# Patient Record
Sex: Female | Born: 1985 | Race: White | Hispanic: No | Marital: Married | Smoking: Never smoker
Health system: Southern US, Community
[De-identification: ages and names within clinical notes are randomized; demographics above are authoritative.]

## PROBLEM LIST (undated history)

## (undated) DIAGNOSIS — Z789 Other specified health status: Secondary | ICD-10-CM

## (undated) DIAGNOSIS — Z973 Presence of spectacles and contact lenses: Secondary | ICD-10-CM

---

## 2016-12-19 ENCOUNTER — Encounter (HOSPITAL_COMMUNITY): Payer: Self-pay | Admitting: *Deleted

## 2016-12-20 ENCOUNTER — Ambulatory Visit (HOSPITAL_COMMUNITY): Payer: BLUE CROSS/BLUE SHIELD | Admitting: Anesthesiology

## 2016-12-20 ENCOUNTER — Encounter (HOSPITAL_COMMUNITY): Payer: Self-pay | Admitting: *Deleted

## 2016-12-20 ENCOUNTER — Ambulatory Visit (HOSPITAL_COMMUNITY)
Admission: RE | Admit: 2016-12-20 | Discharge: 2016-12-20 | Disposition: A | Discharge status: Home / Self Care | Payer: BLUE CROSS/BLUE SHIELD | Source: Ambulatory Visit | Attending: Obstetrics and Gynecology | Admitting: Obstetrics and Gynecology

## 2016-12-20 ENCOUNTER — Encounter (HOSPITAL_COMMUNITY)
Admission: RE | Disposition: A | Discharge status: Home / Self Care | Payer: Self-pay | Source: Ambulatory Visit | Attending: Obstetrics and Gynecology

## 2016-12-20 DIAGNOSIS — O021 Missed abortion: Secondary | ICD-10-CM | POA: Insufficient documentation

## 2016-12-20 HISTORY — PX: DILATION AND EVACUATION: SHX1459

## 2016-12-20 SURGERY — DILATION AND EVACUATION, UTERUS
Anesthesia: General

## 2016-12-20 MED ORDER — ONDANSETRON HCL 4 MG/2ML IJ SOLN
INTRAMUSCULAR | Status: DC | PRN
  Administered 2016-12-20: 4 mg via INTRAVENOUS

## 2016-12-20 MED ORDER — FENTANYL CITRATE (PF) 100 MCG/2ML IJ SOLN
INTRAMUSCULAR | Status: DC | PRN
  Administered 2016-12-20 (×2): 50 ug via INTRAVENOUS

## 2016-12-20 MED ORDER — PROPOFOL 10 MG/ML IV BOLUS
INTRAVENOUS | Status: AC
  Filled 2016-12-20: qty 20

## 2016-12-20 MED ORDER — OXYCODONE HCL 5 MG/5ML PO SOLN: 5.0000 mg | Freq: Once | ORAL | Status: DC | PRN

## 2016-12-20 MED ORDER — MIDAZOLAM HCL 2 MG/2ML IJ SOLN
INTRAMUSCULAR | Status: DC | PRN
  Administered 2016-12-20 (×2): 1 mg via INTRAVENOUS

## 2016-12-20 MED ORDER — MIDAZOLAM HCL 2 MG/2ML IJ SOLN
INTRAMUSCULAR | Status: AC
  Filled 2016-12-20: qty 2

## 2016-12-20 MED ORDER — SCOPOLAMINE 1 MG/3DAYS TD PT72
1.0000 | MEDICATED_PATCH | Freq: Once | TRANSDERMAL | Status: DC
Start: 2016-12-20 — End: 2016-12-20
  Administered 2016-12-20: 1.5 mg via TRANSDERMAL

## 2016-12-20 MED ORDER — ONDANSETRON HCL 4 MG/2ML IJ SOLN
INTRAMUSCULAR | Status: AC
  Filled 2016-12-20: qty 2

## 2016-12-20 MED ORDER — LACTATED RINGERS IV SOLN
INTRAVENOUS | Status: DC
  Administered 2016-12-20: 12:00:00 via INTRAVENOUS

## 2016-12-20 MED ORDER — LIDOCAINE HCL 1 % IJ SOLN
INTRAMUSCULAR | Status: AC
  Filled 2016-12-20: qty 20

## 2016-12-20 MED ORDER — ONDANSETRON HCL 4 MG/2ML IJ SOLN: 4.0000 mg | Freq: Once | INTRAMUSCULAR | Status: DC | PRN

## 2016-12-20 MED ORDER — DEXTROSE-NACL 5-0.9 % IV SOLN: INTRAVENOUS | Status: DC

## 2016-12-20 MED ORDER — DEXAMETHASONE SODIUM PHOSPHATE 10 MG/ML IJ SOLN
INTRAMUSCULAR | Status: DC | PRN
  Administered 2016-12-20: 4 mg via INTRAVENOUS

## 2016-12-20 MED ORDER — LIDOCAINE HCL (CARDIAC) 20 MG/ML IV SOLN
INTRAVENOUS | Status: DC | PRN
  Administered 2016-12-20: 60 mg via INTRAVENOUS

## 2016-12-20 MED ORDER — SCOPOLAMINE 1 MG/3DAYS TD PT72: 1.0000 | MEDICATED_PATCH | Freq: Once | TRANSDERMAL | Status: DC

## 2016-12-20 MED ORDER — LACTATED RINGERS IV SOLN: INTRAVENOUS | Status: DC

## 2016-12-20 MED ORDER — LIDOCAINE HCL 1 % IJ SOLN
INTRAMUSCULAR | Status: DC | PRN
  Administered 2016-12-20: 20 mL

## 2016-12-20 MED ORDER — ACETAMINOPHEN 160 MG/5ML PO SOLN: 325.0000 mg | ORAL | Status: DC | PRN

## 2016-12-20 MED ORDER — HYDROCODONE-ACETAMINOPHEN 5-325 MG PO TABS: ORAL_TABLET | ORAL | 0 refills | Status: DC

## 2016-12-20 MED ORDER — OXYCODONE HCL 5 MG PO TABS: 5.0000 mg | ORAL_TABLET | Freq: Once | ORAL | Status: DC | PRN

## 2016-12-20 MED ORDER — SCOPOLAMINE 1 MG/3DAYS TD PT72
MEDICATED_PATCH | TRANSDERMAL | Status: AC
  Administered 2016-12-20: 1.5 mg via TRANSDERMAL
  Filled 2016-12-20: qty 1

## 2016-12-20 MED ORDER — KETOROLAC TROMETHAMINE 30 MG/ML IJ SOLN
30.0000 mg | Freq: Once | INTRAMUSCULAR | Status: DC | PRN
  Administered 2016-12-20: 30 mg via INTRAVENOUS

## 2016-12-20 MED ORDER — LIDOCAINE HCL (CARDIAC) 20 MG/ML IV SOLN
INTRAVENOUS | Status: AC
  Filled 2016-12-20: qty 5

## 2016-12-20 MED ORDER — ACETAMINOPHEN 325 MG PO TABS: 325.0000 mg | ORAL_TABLET | ORAL | Status: DC | PRN

## 2016-12-20 MED ORDER — KETOROLAC TROMETHAMINE 30 MG/ML IJ SOLN
INTRAMUSCULAR | Status: AC
  Filled 2016-12-20: qty 1

## 2016-12-20 MED ORDER — FENTANYL CITRATE (PF) 100 MCG/2ML IJ SOLN: 25.0000 ug | INTRAMUSCULAR | Status: DC | PRN

## 2016-12-20 MED ORDER — PROPOFOL 10 MG/ML IV BOLUS
INTRAVENOUS | Status: DC | PRN
  Administered 2016-12-20 (×2): 10 mg via INTRAVENOUS
  Administered 2016-12-20: 20 mg via INTRAVENOUS

## 2016-12-20 MED ORDER — MEPERIDINE HCL 25 MG/ML IJ SOLN: 6.2500 mg | INTRAMUSCULAR | Status: DC | PRN

## 2016-12-20 MED ORDER — IBUPROFEN 600 MG PO TABS: 600.0000 mg | ORAL_TABLET | Freq: Four times a day (QID) | ORAL | 0 refills | Status: DC | PRN

## 2016-12-20 MED ORDER — FENTANYL CITRATE (PF) 250 MCG/5ML IJ SOLN
INTRAMUSCULAR | Status: AC
Start: 2016-12-20 — End: ?
  Filled 2016-12-20: qty 5

## 2016-12-20 SURGICAL SUPPLY — 19 items
CATH ROBINSON RED A/P 16FR (CATHETERS) ×2 IMPLANT
CLOTH BEACON ORANGE TIMEOUT ST (SAFETY) ×2 IMPLANT
DECANTER SPIKE VIAL GLASS SM (MISCELLANEOUS) ×2 IMPLANT
DILATOR CANAL MILEX (MISCELLANEOUS) IMPLANT
GLOVE BIO SURGEON STRL SZ7 (GLOVE) ×2 IMPLANT
GLOVE BIOGEL PI IND STRL 7.0 (GLOVE) ×1 IMPLANT
GLOVE BIOGEL PI INDICATOR 7.0 (GLOVE) ×1
GOWN STRL REUS W/TWL LRG LVL3 (GOWN DISPOSABLE) ×4 IMPLANT
KIT BERKELEY 1ST TRIMESTER 3/8 (MISCELLANEOUS) ×2 IMPLANT
NS IRRIG 1000ML POUR BTL (IV SOLUTION) ×2 IMPLANT
PACK VAGINAL MINOR WOMEN LF (CUSTOM PROCEDURE TRAY) ×2 IMPLANT
PAD OB MATERNITY 4.3X12.25 (PERSONAL CARE ITEMS) ×2 IMPLANT
PAD PREP 24X48 CUFFED NSTRL (MISCELLANEOUS) ×2 IMPLANT
SET BERKELEY SUCTION TUBING (SUCTIONS) ×2 IMPLANT
TOWEL OR 17X24 6PK STRL BLUE (TOWEL DISPOSABLE) ×4 IMPLANT
VACURETTE 10 RIGID CVD (CANNULA) IMPLANT
VACURETTE 7MM CVD STRL WRAP (CANNULA) ×2 IMPLANT
VACURETTE 8 RIGID CVD (CANNULA) IMPLANT
VACURETTE 9 RIGID CVD (CANNULA) IMPLANT

## 2016-12-20 NOTE — Anesthesia Preprocedure Evaluation (Signed)
Anesthesia Evaluation  Patient identified by MRN, date of birth, ID band Patient awake    Reviewed: Allergy & Precautions, H&P , NPO status , Patient's Chart, lab work & pertinent test results  Airway Mallampati: I  TM Distance: >3 FB Neck ROM: full    Dental no notable dental hx. (+) Teeth Intact   Pulmonary neg pulmonary ROS,    Pulmonary exam normal breath sounds clear to auscultation       Cardiovascular negative cardio ROS Normal cardiovascular exam Rhythm:regular Rate:Normal     Neuro/Psych negative neurological ROS  negative psych ROS   GI/Hepatic negative GI ROS, Neg liver ROS,   Endo/Other  negative endocrine ROS  Renal/GU negative Renal ROS  negative genitourinary   Musculoskeletal negative musculoskeletal ROS (+)   Abdominal Normal abdominal exam  (+)   Peds  Hematology negative hematology ROS (+)   Anesthesia Other Findings   Reproductive/Obstetrics (+) Pregnancy                             Anesthesia Physical Anesthesia Plan  ASA: II  Anesthesia Plan: General   Post-op Pain Management:    Induction:   PONV Risk Score and Plan: 4 or greater and Ondansetron, Dexamethasone, Midazolam and Scopolamine patch - Pre-op  Airway Management Planned: LMA  Additional Equipment:   Intra-op Plan:   Post-operative Plan:   Informed Consent: I have reviewed the patients History and Physical, chart, labs and discussed the procedure including the risks, benefits and alternatives for the proposed anesthesia with the patient or authorized representative who has indicated his/her understanding and acceptance.     Plan Discussed with: CRNA and Anesthesiologist  Anesthesia Plan Comments:         Anesthesia Quick Evaluation

## 2016-12-20 NOTE — Discharge Instructions (Signed)

## 2016-12-20 NOTE — Anesthesia Postprocedure Evaluation (Signed)
Anesthesia Post Note  Patient: Holly Holder  Procedure(s) Performed: Procedure(s) (LRB): DILATATION AND EVACUATION (N/A)     Patient location during evaluation: PACU Anesthesia Type: MAC Level of consciousness: awake Pain management: pain level controlled Vital Signs Assessment: post-procedure vital signs reviewed and stable Respiratory status: spontaneous breathing Cardiovascular status: stable Postop Assessment: no signs of nausea or vomiting Anesthetic complications: no    Last Vitals:  Vitals:   12/20/16 1344 12/20/16 1345  BP:  99/62  Pulse: 70 69  Resp: 12 16  Temp: 36.7 C   SpO2: 100% 100%    Last Pain:  Vitals:   12/20/16 1344  TempSrc: Oral   Pain Goal: Patients Stated Pain Goal: 3 (12/20/16 1113)               [place]

## 2016-12-20 NOTE — Op Note (Signed)
Pre-Operative Diagnosis: 1) Missed abortion Postoperative Diagnosis: 1) Missed abortion Procedure: Suction dilation and evacuation Surgeon: Dr. Vanessa Kick Assistant: None Operative Findings: POCs Specimen: POCs EBL: minimal  Holly Holder Is a 31 year old gravida 2 para 1001 who presents for definitive surgical management for missed abortion. Please see the patient's history and physical for complete details of the history. Management options were discussed with the patient. R/B/A reviewed. Following appropriate informed consent was taken to the operating room. The patient was appropriately identified during a time out procedure. MAC anesthesia was administered and the patient was placed in the dorsal lithotomy position. The patient was prepped and draped in the normal sterile fashion. A speculum was placed into the vagina, a single-tooth tenaculum was placed on the anterior lip of the cervix, and 10 cc of 1% lidocaine was administered in a paracervical fashion. The cervix was serially dilated with Hank dilators. A #7 suction curet was then passed to the fundus, the vacuum was engaged, and 3 suction passes were performed with a curette. A Sharp curettage was performed and a gritty texture was noted. A final suction pass was performed with minimal results. This completed the procedure. The patient tolerated the procedure well was brought to the recovery room in stable condition for the procedure. All sponge and needle counts correct x2.

## 2016-12-20 NOTE — H&P (Signed)
Holly Holder is an 31 y.o. female.  31 yo G2P1001 Presents for definitive surgical management of a missed miscarriage. THe patient has been followed since early august for an inappropriately rising quant and vaginal bleeding. Serial ultrasounds demonstrated that the pregnancy was not developing correctly. The patient attempted medical management with misoprostal however continues to have POCs. Therefore the decision was made to proceed with suction D&E. R/B/A of the procedure were discussed at length and the wishes to proceed. Pt is Rh negative and received rhogam on 11/19/2016   Patient's last menstrual period was 10/08/2016.    Past Medical History:  Diagnosis Date  . SVD (spontaneous vaginal delivery)    x 1    History reviewed. No pertinent surgical history.  History reviewed. No pertinent family history.  Social History:  reports that she has never smoked. She has never used smokeless tobacco. She reports that she does not drink alcohol or use drugs.  Allergies: No Known Allergies  Prescriptions Prior to Admission  Medication Sig Dispense Refill Last Dose  . misoprostol (CYTOTEC) 200 MCG tablet misoprostol 200 mcg tablet  Take 4 tablets once by oral route.   12/19/2016 at Unknown time    ROS  Blood pressure 105/72, pulse 72, temperature 98.5 F (36.9 C), temperature source Oral, resp. rate 18, height 5\' 4"  (1.626 m), weight 53.5 kg (118 lb), last menstrual period 10/08/2016, SpO2 100 %. Physical Exam   AOX3 Normal work of breathing  Abd soft  No results found for this or any previous visit (from the past 24 hour(s)).  No results found.  Assessment/Plan: 1) Admit 2) Proceed with Suction D&E 3) SCDs  [name] H. 12/20/2016, 11:53 AM

## 2016-12-20 NOTE — Transfer of Care (Signed)
Immediate Anesthesia Transfer of Care Note  Patient: Holly Holder  Procedure(s) Performed: Procedure(s): DILATATION AND EVACUATION (N/A)  Patient Location: PACU  Anesthesia Type:MAC  Level of Consciousness: sedated  Airway & Oxygen Therapy: Patient Spontanous Breathing  Post-op Assessment: Report given to RN and Post -op Vital signs reviewed and stable  Post vital signs: Reviewed and stable  Last Vitals:  Vitals:   12/20/16 1345 12/20/16 1415  BP: 99/62 (!) 99/52  Pulse: 69 62  Resp: 16 18  Temp:    SpO2: 100% 100%    Last Pain:  Vitals:   12/20/16 1344  TempSrc: Oral      Patients Stated Pain Goal: 3 (43/73/57 8978)  Complications: No apparent anesthesia complications

## 2016-12-21 ENCOUNTER — Encounter (HOSPITAL_COMMUNITY): Payer: Self-pay | Admitting: Obstetrics and Gynecology

## 2016-12-24 LAB — TYPE AND SCREEN
ABO/RH(D): A NEG
Antibody Screen: POSITIVE
DAT, IgG: NEGATIVE
Unit division: 0
Unit division: 0

## 2016-12-24 LAB — BPAM RBC
Blood Product Expiration Date: 201810022359
Blood Product Expiration Date: 201810022359
Unit Type and Rh: 600
Unit Type and Rh: 600

## 2017-04-16 NOTE — L&D Delivery Note (Signed)
Patient was C/C/+2 and pushed for approx 1hr 30 minutes with epidural.    NSVD  Female infant, Apgars 9/9, weight pending.   The patient had 2nd degree laceration extending superficially to anus, but not sphincter capsule intact and tear not extending into anus, this was repaired with 2-0 vicryl. Fundus was firm. EBL was expected amount. Placenta was delivered in 2 pieces after cord evulsed Vagina was clear.  Delayed cord clamping done for 30-60 seconds while warming baby. Baby was vigorous and doing skin to skin with mother.  Philip AspenALLAHAN, [place] Ohlrich

## 2017-05-07 LAB — OB RESULTS CONSOLE GC/CHLAMYDIA: Gonorrhea: NEGATIVE

## 2017-05-20 LAB — OB RESULTS CONSOLE HIV ANTIBODY (ROUTINE TESTING): HIV: NONREACTIVE

## 2017-05-20 LAB — OB RESULTS CONSOLE HEPATITIS B SURFACE ANTIGEN: Hepatitis B Surface Ag: NEGATIVE

## 2017-05-20 LAB — OB RESULTS CONSOLE RUBELLA ANTIBODY, IGM: Rubella: IMMUNE

## 2017-09-17 LAB — OB RESULTS CONSOLE RPR: RPR: NONREACTIVE

## 2017-10-07 ENCOUNTER — Encounter (HOSPITAL_COMMUNITY): Payer: Self-pay

## 2017-11-20 LAB — OB RESULTS CONSOLE GBS: GBS: NEGATIVE

## 2017-12-14 ENCOUNTER — Inpatient Hospital Stay (HOSPITAL_COMMUNITY): Payer: BLUE CROSS/BLUE SHIELD | Admitting: Anesthesiology

## 2017-12-14 ENCOUNTER — Other Ambulatory Visit: Payer: Self-pay

## 2017-12-14 ENCOUNTER — Encounter (HOSPITAL_COMMUNITY): Payer: Self-pay | Admitting: *Deleted

## 2017-12-14 ENCOUNTER — Inpatient Hospital Stay (HOSPITAL_COMMUNITY)
Admission: AD | Admit: 2017-12-14 | Discharge: 2017-12-16 | DRG: 807 | Disposition: A | Discharge status: Home / Self Care | Payer: BLUE CROSS/BLUE SHIELD | Attending: Obstetrics and Gynecology | Admitting: Obstetrics and Gynecology

## 2017-12-14 DIAGNOSIS — Z3A39 39 weeks gestation of pregnancy: Secondary | ICD-10-CM

## 2017-12-14 DIAGNOSIS — Z3483 Encounter for supervision of other normal pregnancy, third trimester: Secondary | ICD-10-CM | POA: Diagnosis not present

## 2017-12-14 LAB — CBC
HCT: 34.6 % — ABNORMAL LOW (ref 36.0–46.0)
Hemoglobin: 11.9 g/dL — ABNORMAL LOW (ref 12.0–15.0)
MCH: 29.7 pg (ref 26.0–34.0)
MCHC: 34.4 g/dL (ref 30.0–36.0)
MCV: 86.3 fL (ref 78.0–100.0)
Platelets: 194 10*3/uL (ref 150–400)
RBC: 4.01 MIL/uL (ref 3.87–5.11)
RDW: 13.4 % (ref 11.5–15.5)
WBC: 8.3 10*3/uL (ref 4.0–10.5)

## 2017-12-14 LAB — TYPE AND SCREEN
ABO/RH(D): A NEG
Antibody Screen: NEGATIVE

## 2017-12-14 MED ORDER — OXYTOCIN 40 UNITS IN LACTATED RINGERS INFUSION - SIMPLE MED
2.5000 [IU]/h | INTRAVENOUS | Status: DC
  Administered 2017-12-14: 2.5 [IU]/h via INTRAVENOUS
  Filled 2017-12-14: qty 1000

## 2017-12-14 MED ORDER — EPHEDRINE 5 MG/ML INJ
10.0000 mg | INTRAVENOUS | Status: DC | PRN
  Filled 2017-12-14: qty 2

## 2017-12-14 MED ORDER — CLINDAMYCIN PHOSPHATE 900 MG/50ML IV SOLN: 900.0000 mg | Freq: Once | INTRAVENOUS | Status: DC

## 2017-12-14 MED ORDER — GENTAMICIN SULFATE 40 MG/ML IJ SOLN
1.5000 mg/kg | Freq: Once | INTRAVENOUS | Status: AC
  Administered 2017-12-14: 100 mg via INTRAVENOUS
  Filled 2017-12-14: qty 2.5

## 2017-12-14 MED ORDER — LIDOCAINE HCL (PF) 1 % IJ SOLN
30.0000 mL | INTRAMUSCULAR | Status: DC | PRN
  Filled 2017-12-14: qty 30

## 2017-12-14 MED ORDER — DIPHENHYDRAMINE HCL 50 MG/ML IJ SOLN: 12.5000 mg | INTRAMUSCULAR | Status: DC | PRN

## 2017-12-14 MED ORDER — OXYCODONE-ACETAMINOPHEN 5-325 MG PO TABS: 2.0000 | ORAL_TABLET | ORAL | Status: DC | PRN

## 2017-12-14 MED ORDER — FENTANYL 2.5 MCG/ML BUPIVACAINE 1/10 % EPIDURAL INFUSION (WH - ANES)
14.0000 mL/h | INTRAMUSCULAR | Status: DC | PRN
  Administered 2017-12-14: 14 mL/h via EPIDURAL
  Filled 2017-12-14: qty 100

## 2017-12-14 MED ORDER — LACTATED RINGERS IV SOLN
INTRAVENOUS | Status: DC
  Administered 2017-12-14: 19:00:00 via INTRAVENOUS

## 2017-12-14 MED ORDER — PHENYLEPHRINE 40 MCG/ML (10ML) SYRINGE FOR IV PUSH (FOR BLOOD PRESSURE SUPPORT)
80.0000 ug | PREFILLED_SYRINGE | INTRAVENOUS | Status: DC | PRN
  Filled 2017-12-14: qty 5

## 2017-12-14 MED ORDER — SOD CITRATE-CITRIC ACID 500-334 MG/5ML PO SOLN: 30.0000 mL | ORAL | Status: DC | PRN

## 2017-12-14 MED ORDER — GENTAMICIN SULFATE 40 MG/ML IJ SOLN: 1.5000 mg/kg | Freq: Once | INTRAVENOUS | Status: DC

## 2017-12-14 MED ORDER — LIDOCAINE HCL (PF) 1 % IJ SOLN
INTRAMUSCULAR | Status: DC | PRN
  Administered 2017-12-14: 6 mL via EPIDURAL
  Administered 2017-12-14 (×2): 4 mL via EPIDURAL

## 2017-12-14 MED ORDER — OXYTOCIN BOLUS FROM INFUSION
500.0000 mL | Freq: Once | INTRAVENOUS | Status: AC
  Administered 2017-12-14: 500 mL via INTRAVENOUS

## 2017-12-14 MED ORDER — PHENYLEPHRINE 40 MCG/ML (10ML) SYRINGE FOR IV PUSH (FOR BLOOD PRESSURE SUPPORT)
80.0000 ug | PREFILLED_SYRINGE | INTRAVENOUS | Status: DC | PRN
  Administered 2017-12-14: 80 ug via INTRAVENOUS
  Filled 2017-12-14: qty 10
  Filled 2017-12-14: qty 5

## 2017-12-14 MED ORDER — FLEET ENEMA 7-19 GM/118ML RE ENEM: 1.0000 | ENEMA | RECTAL | Status: DC | PRN

## 2017-12-14 MED ORDER — LACTATED RINGERS IV SOLN: 500.0000 mL | INTRAVENOUS | Status: DC | PRN

## 2017-12-14 MED ORDER — ONDANSETRON HCL 4 MG/2ML IJ SOLN: 4.0000 mg | Freq: Four times a day (QID) | INTRAMUSCULAR | Status: DC | PRN

## 2017-12-14 MED ORDER — ACETAMINOPHEN 325 MG PO TABS: 650.0000 mg | ORAL_TABLET | ORAL | Status: DC | PRN

## 2017-12-14 MED ORDER — LACTATED RINGERS IV SOLN
500.0000 mL | Freq: Once | INTRAVENOUS | Status: AC
  Administered 2017-12-14: 500 mL via INTRAVENOUS

## 2017-12-14 MED ORDER — OXYCODONE-ACETAMINOPHEN 5-325 MG PO TABS: 1.0000 | ORAL_TABLET | ORAL | Status: DC | PRN

## 2017-12-14 NOTE — Anesthesia Preprocedure Evaluation (Signed)
Anesthesia Evaluation  Patient identified by MRN, date of birth, ID band Patient awake    Reviewed: Allergy & Precautions, NPO status , Patient's Chart, lab work & pertinent test results  Airway Mallampati: I  TM Distance: >3 FB Neck ROM: Full    Dental no notable dental hx.    Pulmonary neg pulmonary ROS,    Pulmonary exam normal breath sounds clear to auscultation       Cardiovascular negative cardio ROS Normal cardiovascular exam Rhythm:Regular Rate:Normal     Neuro/Psych negative neurological ROS  negative psych ROS   GI/Hepatic negative GI ROS, Neg liver ROS,   Endo/Other  negative endocrine ROS  Renal/GU negative Renal ROS  negative genitourinary   Musculoskeletal negative musculoskeletal ROS (+)   Abdominal   Peds negative pediatric ROS (+)  Hematology negative hematology ROS (+)   Anesthesia Other Findings   Reproductive/Obstetrics negative OB ROS (+) Pregnancy                             Anesthesia Physical Anesthesia Plan  ASA: II  Anesthesia Plan: Epidural   Post-op Pain Management:    Induction:   PONV Risk Score and Plan: Treatment may vary due to age or medical condition  Airway Management Planned: Natural Airway  Additional Equipment:   Intra-op Plan:   Post-operative Plan:   Informed Consent: I have reviewed the patients History and Physical, chart, labs and discussed the procedure including the risks, benefits and alternatives for the proposed anesthesia with the patient or authorized representative who has indicated his/her understanding and acceptance.     Plan Discussed with: Anesthesiologist  Anesthesia Plan Comments: (Patient identified. Risks, benefits, options discussed with patient including but not limited to bleeding, infection, nerve damage, paralysis, failed block, incomplete pain control, headache, blood pressure changes, nausea, vomiting,  reactions to medication, itching, and post partum back pain. Confirmed with bedside nurse the patient's most recent platelet count. Confirmed with the patient that they are not taking any anticoagulation, have any bleeding history or any family history of bleeding disorders. Patient expressed understanding and wishes to proceed. All questions were answered. )        Anesthesia Quick Evaluation

## 2017-12-14 NOTE — H&P (Signed)
32 y.o. [id number]  G3P1000 comes in c/o ctx.  Otherwise has good fetal movement and no bleeding.  Was checked in MAU and found to be 7cm.  During exam ROM occurred per pt.  Past Medical History:  Diagnosis Date  . SVD (spontaneous vaginal delivery)    x 1    Past Surgical History:  Procedure Laterality Date  . DILATION AND EVACUATION N/A 12/20/2016   Procedure: DILATATION AND EVACUATION;  Surgeon: Waynard Reedsoss, Kendra, MD;  Location: [facility];  Service: Gynecology;  Laterality: N/A;    OB History  Gravida Para Term Preterm AB Living  3 1 1         SAB TAB Ectopic Multiple Live Births          1    # Outcome Date GA Lbr Len/2nd Weight Sex Delivery Anes PTL Lv  3 Current           2 Term           1 Gravida             Social History   Socioeconomic History  . Marital status: Married    Spouse name: Not on file  . Number of children: Not on file  . Years of education: Not on file  . Highest education level: Not on file  Occupational History  . Not on file  Social Needs  . Financial resource strain: Not on file  . Food insecurity:    Worry: Not on file    Inability: Not on file  . Transportation needs:    Medical: Not on file    Non-medical: Not on file  Tobacco Use  . Smoking status: Never Smoker  . Smokeless tobacco: Never Used  Substance and Sexual Activity  . Alcohol use: No  . Drug use: No  . Sexual activity: Yes    Birth control/protection: None    Comment: preg- LMP 10/08/16  Lifestyle  . Physical activity:    Days per week: Not on file    Minutes per session: Not on file  . Stress: Not on file  Relationships  . Social connections:    Talks on phone: Not on file    Gets together: Not on file    Attends religious service: Not on file    Active member of club or organization: Not on file    Attends meetings of clubs or organizations: Not on file    Relationship status: Not on file  . Intimate partner violence:    Fear of current or ex partner: Not on file   Emotionally abused: Not on file    Physically abused: Not on file    Forced sexual activity: Not on file  Other Topics Concern  . Not on file  Social History Narrative  . Not on file   Patient has no known allergies.    Prenatal Transfer Tool  Maternal Diabetes: No Genetic Screening: Normal Maternal Ultrasounds/Referrals: Normal Fetal Ultrasounds or other Referrals:  None Maternal Substance Abuse:  No Significant Maternal Medications:  None Significant Maternal Lab Results: Lab values include: Group B Strep negative  Other PNC: uncomplicated.    Vitals:   12/14/17 1740 12/14/17 1758  BP: (!) 123/57   Pulse: (!) 108   Resp: 18   TempSrc: Oral   SpO2: 99%   Weight:  64.4 kg    Lungs/Cor:  NAD Abdomen:  soft, gravid Ex:  no cords, erythema SVE: 7 per MAU FHTs:  145, good STV, NST  R Toco:  q2-3   A/P   Admit to L&D with labor  GBS Neg  Epidural requested  Routine care  Sebastian, Luther Parody

## 2017-12-14 NOTE — Anesthesia Procedure Notes (Signed)
Epidural Patient location during procedure: OB Start time: 12/14/2017 6:35 PM End time: 12/14/2017 6:50 PM  Staffing Anesthesiologist: Elmer PickerWoodrum, Chelsey L, MD Performed: anesthesiologist   Preanesthetic Checklist Completed: patient identified, pre-op evaluation, timeout performed, IV checked, risks and benefits discussed and monitors and equipment checked  Epidural Patient position: sitting Prep: site prepped and draped and DuraPrep Patient monitoring: continuous pulse ox, blood pressure, heart rate and cardiac monitor Approach: midline Location: L3-L4 Injection technique: LOR air  Needle:  Needle type: Tuohy  Needle gauge: 17 G Needle length: 9 cm Needle insertion depth: 4 cm Catheter type: closed end flexible Catheter size: 19 Gauge Catheter at skin depth: 9 cm Test dose: negative  Assessment Sensory level: T8 Events: blood not aspirated, injection not painful, no injection resistance, negative IV test and no paresthesia  Additional Notes Patient identified. Risks/Benefits/Options discussed with patient including but not limited to bleeding, infection, nerve damage, paralysis, failed block, incomplete pain control, headache, blood pressure changes, nausea, vomiting, reactions to medication both or allergic, itching and postpartum back pain. Confirmed with bedside nurse the patient's most recent platelet count. Confirmed with patient that they are not currently taking any anticoagulation, have any bleeding history or any family history of bleeding disorders. Patient expressed understanding and wished to proceed. All questions were answered. Sterile technique was used throughout the entire procedure. Please see nursing notes for vital signs. Test dose was given through epidural catheter and negative prior to continuing to dose epidural or start infusion. Warning signs of high block given to the patient including shortness of breath, tingling/numbness in hands, complete motor block, or  any concerning symptoms with instructions to call for help. Patient was given instructions on fall risk and not to get out of bed. All questions and concerns addressed with instructions to call with any issues or inadequate analgesia.  Reason for block:procedure for pain

## 2017-12-14 NOTE — MAU Note (Signed)
+  contractions  Since 230 am 3 minutes apart she reports  Denies vb  +lof . Clear x3  +FM  States was last seen in the office on Friday but deferred her vaginal exam.  Would like an epidural.

## 2017-12-15 ENCOUNTER — Encounter (HOSPITAL_COMMUNITY): Payer: Self-pay

## 2017-12-15 LAB — CBC
HCT: 29.6 % — ABNORMAL LOW (ref 36.0–46.0)
Hemoglobin: 10 g/dL — ABNORMAL LOW (ref 12.0–15.0)
MCH: 29.1 pg (ref 26.0–34.0)
MCHC: 33.8 g/dL (ref 30.0–36.0)
MCV: 86 fL (ref 78.0–100.0)
Platelets: 174 10*3/uL (ref 150–400)
RBC: 3.44 MIL/uL — ABNORMAL LOW (ref 3.87–5.11)
RDW: 13.4 % (ref 11.5–15.5)
WBC: 10.4 10*3/uL (ref 4.0–10.5)

## 2017-12-15 LAB — RPR: RPR Ser Ql: NONREACTIVE

## 2017-12-15 MED ORDER — TETANUS-DIPHTH-ACELL PERTUSSIS 5-2.5-18.5 LF-MCG/0.5 IM SUSP: 0.5000 mL | Freq: Once | INTRAMUSCULAR | Status: DC

## 2017-12-15 MED ORDER — SENNOSIDES-DOCUSATE SODIUM 8.6-50 MG PO TABS
2.0000 | ORAL_TABLET | ORAL | Status: DC
  Administered 2017-12-16: 2 via ORAL
  Filled 2017-12-15: qty 2

## 2017-12-15 MED ORDER — ZOLPIDEM TARTRATE 5 MG PO TABS: 5.0000 mg | ORAL_TABLET | Freq: Every evening | ORAL | Status: DC | PRN

## 2017-12-15 MED ORDER — COCONUT OIL OIL: 1.0000 "application " | TOPICAL_OIL | Status: DC | PRN

## 2017-12-15 MED ORDER — SIMETHICONE 80 MG PO CHEW: 80.0000 mg | CHEWABLE_TABLET | ORAL | Status: DC | PRN

## 2017-12-15 MED ORDER — ACETAMINOPHEN 325 MG PO TABS: 650.0000 mg | ORAL_TABLET | ORAL | Status: DC | PRN

## 2017-12-15 MED ORDER — PRENATAL MULTIVITAMIN CH
1.0000 | ORAL_TABLET | Freq: Every day | ORAL | Status: DC
  Administered 2017-12-15: 1 via ORAL
  Filled 2017-12-15: qty 1

## 2017-12-15 MED ORDER — DIPHENHYDRAMINE HCL 25 MG PO CAPS: 25.0000 mg | ORAL_CAPSULE | Freq: Four times a day (QID) | ORAL | Status: DC | PRN

## 2017-12-15 MED ORDER — WITCH HAZEL-GLYCERIN EX PADS: 1.0000 "application " | MEDICATED_PAD | CUTANEOUS | Status: DC | PRN

## 2017-12-15 MED ORDER — ONDANSETRON HCL 4 MG/2ML IJ SOLN: 4.0000 mg | INTRAMUSCULAR | Status: DC | PRN

## 2017-12-15 MED ORDER — RHO D IMMUNE GLOBULIN 1500 UNIT/2ML IJ SOSY
300.0000 ug | PREFILLED_SYRINGE | Freq: Once | INTRAMUSCULAR | Status: AC
  Administered 2017-12-15: 300 ug via INTRAVENOUS
  Filled 2017-12-15: qty 2

## 2017-12-15 MED ORDER — ONDANSETRON HCL 4 MG PO TABS: 4.0000 mg | ORAL_TABLET | ORAL | Status: DC | PRN

## 2017-12-15 MED ORDER — OXYCODONE-ACETAMINOPHEN 5-325 MG PO TABS
1.0000 | ORAL_TABLET | ORAL | Status: DC | PRN
  Administered 2017-12-15: 1 via ORAL
  Filled 2017-12-15: qty 1

## 2017-12-15 MED ORDER — BENZOCAINE-MENTHOL 20-0.5 % EX AERO
1.0000 "application " | INHALATION_SPRAY | CUTANEOUS | Status: DC | PRN
  Filled 2017-12-15: qty 56

## 2017-12-15 MED ORDER — IBUPROFEN 600 MG PO TABS
600.0000 mg | ORAL_TABLET | Freq: Four times a day (QID) | ORAL | Status: DC
  Administered 2017-12-15 – 2017-12-16 (×5): 600 mg via ORAL
  Filled 2017-12-15 (×5): qty 1

## 2017-12-15 MED ORDER — DIBUCAINE 1 % RE OINT: 1.0000 "application " | TOPICAL_OINTMENT | RECTAL | Status: DC | PRN

## 2017-12-15 MED ORDER — OXYCODONE-ACETAMINOPHEN 5-325 MG PO TABS: 2.0000 | ORAL_TABLET | ORAL | Status: DC | PRN

## 2017-12-15 NOTE — Anesthesia Postprocedure Evaluation (Signed)
Anesthesia Post Note  Patient: Holly Holder  Procedure(s) Performed: AN AD HOC LABOR EPIDURAL     Patient location during evaluation: Mother Baby Anesthesia Type: Epidural Level of consciousness: awake and alert Pain management: pain level controlled Vital Signs Assessment: post-procedure vital signs reviewed and stable Respiratory status: spontaneous breathing Cardiovascular status: blood pressure returned to baseline Postop Assessment: no headache, no backache, epidural receding, able to ambulate, adequate PO intake, no apparent nausea or vomiting and patient able to bend at knees Anesthetic complications: no    Last Vitals:  Vitals:   12/15/17 0115 12/15/17 0524  BP: 97/64 93/62  Pulse: 87 88  Resp: 16 18  Temp: 37.3 C 37.2 C  SpO2: 100% 100%    Last Pain:  Vitals:   12/15/17 0635  TempSrc:   PainSc: 2    Pain Goal:                 [facility]

## 2017-12-15 NOTE — Lactation Note (Signed)
This note was copied from a baby's chart. Lactation Consultation Note  Patient Name: Girl Calyssa Yardley KZSWF'U Date: 12/15/2017 Reason for consult: Initial assessment;Term  P2 mother whose infant is now 67 hours old.  Baby sleeping on mother's chest as I arrived.  Family in room visiting.  Mother stated that breastfeeding is going well so far.  Encouraged her to feed 8-12 times/24 hours or sooner if baby shows feeding cues.  Reviewed feeding cues.  Discussed the importance of hand expression before/after feedings.  Colostrum container provided for any EBM mother may obtain with hand expression.  Milk storage times reviewed.    Mom made aware of O/P services, breastfeeding support groups, community resources, and our phone # for post-discharge questions. Offered to assist with latch the next time baby shows cues.  Mother will call as needed.      Maternal Data Formula Feeding for Exclusion: No Has patient been taught Hand Expression?: Yes  Feeding    LATCH Score                   Interventions    Lactation Tools Discussed/Used WIC Program: No   Consult Status Consult Status: Follow-up Date: 12/16/17 Follow-up type: In-patient    Priscila Bean R Tinley Rought 12/15/2017, 11:33 AM

## 2017-12-15 NOTE — Progress Notes (Signed)
Patient is eating, ambulating, voiding.  Pain control is good.  Appropriate lochia, no complaints.  Vitals:   12/14/17 2331 12/15/17 0005 12/15/17 0115 12/15/17 0524  BP: 103/62 (!) 98/58 97/64 93/62   Pulse: 82 78 87 88  Resp: 15 16 16 18   Temp:  98.5 F (36.9 C) 99.2 F (37.3 C) 99 F (37.2 C)  TempSrc:  Oral Oral Oral  SpO2:  100% 100% 100%  Weight:      Height:        Fundus firm Ext: no calf tenderness  Lab Results  Component Value Date   WBC 10.4 12/15/2017   HGB 10.0 (L) 12/15/2017   HCT 29.6 (L) 12/15/2017   MCV 86.0 12/15/2017   PLT 174 12/15/2017    --/--/A NEG Performed at [facility], [address] [place], [place] [address]  (09/01 0340)  A/P Post partum day 1 Doing well.  Routine care.  Expect d/c 9/2.    Holly Holder

## 2017-12-16 LAB — RH IG WORKUP (INCLUDES ABO/RH)
ABO/RH(D): A NEG
Gestational Age(Wks): 39.1
Unit division: 0

## 2017-12-16 MED ORDER — IBUPROFEN 600 MG PO TABS: 600.0000 mg | ORAL_TABLET | Freq: Four times a day (QID) | ORAL | 0 refills | Status: DC | PRN

## 2017-12-16 MED ORDER — OXYCODONE-ACETAMINOPHEN 5-325 MG PO TABS: 2.0000 | ORAL_TABLET | ORAL | 0 refills | Status: DC | PRN

## 2017-12-16 NOTE — Progress Notes (Signed)
PPD#2 Pt without complaints. Would like to go home VSSAF IMP/ Stable Plan/ Will discharge

## 2017-12-16 NOTE — Lactation Note (Signed)
This note was copied from a baby's chart. Lactation Consultation Note  Patient Name: Holly Holder Date: 12/16/2017 Reason for consult: Follow-up assessment;Term  P2 mother whose infant is now 69 hours old.  RN in room and mother was ready to breastfeed when I entered.    Mother wanted me to show her the football hold.  Positioned mother and baby appropriately and assisted her to latch on the right breast without difficulty.  Baby's lips were flanged and swallows noted.  Mother hesitant to hold baby in deep and to keep her active at breast.  I needed to remind her a couple of times.  I noticed that, at some point, baby had not been latching deep.  Mother has slight abrasion to nipples.  Offered comfort gels and mother accepted; directions for use given.  Mother is somewhat anxious and asked many questions.  I reassured her and offered our OP services if she gets home and continues to have questions or wants to come in for a visit; reminded her to call her insurance company to see if this service is covered.  Mother also asked about pacifier use and I suggested she abstain from using a pacifier for 2-4 weeks if possible.  Provided alternative methods for soothing baby.    Engorgement prevention/treatment discussed.  Mother offered a hand pump but declined.  She has a DEBP for home use.  She will call for assistance as needed prior to discharge.  RN updated and she will obtain baby's PKU.  Mother aware.   Maternal Data Formula Feeding for Exclusion: No Has patient been taught Hand Expression?: Yes Does the patient have breastfeeding experience prior to this delivery?: Yes  Feeding Feeding Type: Breast Fed Length of feed: 5 min(still feeding when I left the room)  LATCH Score Latch: Grasps breast easily, tongue down, lips flanged, rhythmical sucking.  Audible Swallowing: A few with stimulation  Type of Nipple: Everted at rest and after stimulation  Comfort  (Breast/Nipple): Soft / non-tender  Hold (Positioning): Assistance needed to correctly position infant at breast and maintain latch.  LATCH Score: 8  Interventions Interventions: Breast feeding basics reviewed;Assisted with latch;Skin to skin;Breast massage;Hand express;Position options;Support pillows;Adjust position;Breast compression;Comfort gels  Lactation Tools Discussed/Used Tools: Comfort gels   Consult Status Consult Status: Complete Date: 12/16/17 Follow-up type: In-patient    Holly Holder 12/16/2017, 8:42 AM

## 2017-12-16 NOTE — Discharge Summary (Signed)
Obstetric Discharge Summary Reason for Admission: onset of labor Prenatal Procedures: ultrasound Intrapartum Procedures: spontaneous vaginal delivery Postpartum Procedures: none Complications-Operative and Postpartum: 2nd degree perineal laceration Hemoglobin  Date Value Ref Range Status  12/15/2017 10.0 (L) 12.0 - 15.0 g/dL Final   HCT  Date Value Ref Range Status  12/15/2017 29.6 (L) 36.0 - 46.0 % Final    Physical Exam:  General: alert and cooperative Lochia: appropriate Uterine Fundus: firm   Discharge Diagnoses: Term Pregnancy-delivered  Discharge Information: Date: 12/16/2017 Activity: pelvic rest Diet: routine Medications: PNV, Ibuprofen and Percocet Condition: stable Instructions: refer to practice specific booklet Discharge to: home Follow-up Information    Philip Aspen, DO. Schedule an appointment as soon as possible for a visit in 1 month(s).   Specialty:  Obstetrics and Gynecology Contact information: [address] [place] [address] [phone]           Newborn Data: Live born female  Birth Weight: 7 lb 6.7 oz (3365 g) APGAR: 9, 9  Newborn Delivery   Birth date/time:  12/14/2017 21:41:00 Delivery type:  Vaginal, Spontaneous     Home with mother.  ANDERSON,MARK E 12/16/2017, 10:38 AM

## 2019-06-20 ENCOUNTER — Ambulatory Visit: Payer: Self-pay | Attending: Internal Medicine

## 2019-06-20 DIAGNOSIS — Z23 Encounter for immunization: Secondary | ICD-10-CM | POA: Insufficient documentation

## 2019-06-20 NOTE — Progress Notes (Signed)
   Covid-19 Vaccination Clinic  Name:  Holly Holder    MRN: [id number] DOB: 10-12-1985  06/20/2019  [name] was observed post Covid-19 immunization for 15 minutes without incident. She was provided  with Vaccine Information Sheet and instruction to access the V-Safe system.   [name] was instructed to call 911 with any severe reactions post vaccine: Marland Kitchen Difficulty breathing  . Swelling of face and throat  . A fast heartbeat  . A bad rash all over body  . Dizziness and weakness   Immunizations Administered    Name Date Dose VIS Date Route   Pfizer COVID-19 Vaccine 06/20/2019  4:05 PM 0.3 mL 03/27/2019 Intramuscular   Manufacturer: [employer], [employer]   Lot: [id number]   NDC: [address]-6

## 2019-07-08 DIAGNOSIS — J019 Acute sinusitis, unspecified: Secondary | ICD-10-CM | POA: Diagnosis not present

## 2019-07-11 ENCOUNTER — Ambulatory Visit: Payer: Self-pay | Attending: Internal Medicine

## 2019-07-11 DIAGNOSIS — Z23 Encounter for immunization: Secondary | ICD-10-CM

## 2019-07-11 NOTE — Progress Notes (Signed)
   Covid-19 Vaccination Clinic  Name:  Tilly Pernice    MRN: [id number] DOB: [birth year]  07/11/2019  [name] was observed post Covid-19 immunization for 15 minutes without incident. She was provided with Vaccine Information Sheet and instruction to access the V-Safe system.   Ms. Strahm was instructed to call 911 with any severe reactions post vaccine: Marland Kitchen Difficulty breathing  . Swelling of face and throat  . A fast heartbeat  . A bad rash all over body  . Dizziness and weakness   Immunizations Administered    Name Date Dose VIS Date Route   Pfizer COVID-19 Vaccine 07/11/2019  9:40 AM 0.3 mL 03/27/2019 Intramuscular   Manufacturer: [employer], [employer]   Lot: [id number]   NDC: [address]-9

## 2019-09-22 ENCOUNTER — Other Ambulatory Visit: Payer: Self-pay

## 2019-09-22 ENCOUNTER — Ambulatory Visit: Payer: BLUE CROSS/BLUE SHIELD | Attending: Internal Medicine

## 2019-09-22 DIAGNOSIS — Z20822 Contact with and (suspected) exposure to covid-19: Secondary | ICD-10-CM | POA: Insufficient documentation

## 2019-09-23 ENCOUNTER — Ambulatory Visit: Payer: BLUE CROSS/BLUE SHIELD | Attending: Internal Medicine

## 2019-09-23 DIAGNOSIS — Z20822 Contact with and (suspected) exposure to covid-19: Secondary | ICD-10-CM | POA: Insufficient documentation

## 2019-09-23 LAB — SARS-COV-2, NAA 2 DAY TAT

## 2019-09-23 LAB — NOVEL CORONAVIRUS, NAA: SARS-CoV-2, NAA: NOT DETECTED

## 2019-09-24 LAB — NOVEL CORONAVIRUS, NAA: SARS-CoV-2, NAA: NOT DETECTED

## 2019-09-24 LAB — SARS-COV-2, NAA 2 DAY TAT

## 2019-12-09 ENCOUNTER — Other Ambulatory Visit: Payer: Self-pay

## 2019-12-09 DIAGNOSIS — Z20822 Contact with and (suspected) exposure to covid-19: Secondary | ICD-10-CM

## 2019-12-10 LAB — SARS-COV-2, NAA 2 DAY TAT

## 2019-12-10 LAB — NOVEL CORONAVIRUS, NAA: SARS-CoV-2, NAA: NOT DETECTED

## 2019-12-14 DIAGNOSIS — R05 Cough: Secondary | ICD-10-CM | POA: Diagnosis not present

## 2019-12-14 DIAGNOSIS — Z20828 Contact with and (suspected) exposure to other viral communicable diseases: Secondary | ICD-10-CM | POA: Diagnosis not present

## 2019-12-15 DIAGNOSIS — Z20828 Contact with and (suspected) exposure to other viral communicable diseases: Secondary | ICD-10-CM | POA: Diagnosis not present

## 2019-12-15 DIAGNOSIS — R05 Cough: Secondary | ICD-10-CM | POA: Diagnosis not present

## 2020-01-18 DIAGNOSIS — Z01419 Encounter for gynecological examination (general) (routine) without abnormal findings: Secondary | ICD-10-CM | POA: Diagnosis not present

## 2020-04-16 DIAGNOSIS — Z8616 Personal history of COVID-19: Secondary | ICD-10-CM

## 2020-04-16 HISTORY — DX: Personal history of COVID-19: Z86.16

## 2021-04-16 NOTE — L&D Delivery Note (Signed)
Delivery Note At 4:58 PM a viable and healthy female was delivered via Vaginal, Spontaneous (Presentation: Middle Occiput Posterior).  APGAR: 8, 9; weight pending .   Placenta status: Manual removal, Intact.  Cord: 3 vessels  The patient pushed for approximately 30 minutes and delivered a vigorous female in the vertex direct OP presentation with Apgar scores of 8 at 1 minute and 9 at 5 minutes.  A loose nuchal cord was noted following delivery of the head and this was reduced prior to delivery of the infant's shoulders.  The posterior shoulder delivered first followed by the anterior shoulder.  The infant was passed to the waiting maternal abdomen following delivery.  Following a 1 minute delay, the cord was clamped and cut.  The placenta did not immediately deliver.  A partial third degree was noted.  The third-degree portion of the perineal laceration was repaired with 0 Vicryl interrupted sutures.  The second-degree portion of the laceration was repaired with 3-0 Vicryl.  The placenta was partially detached and the anterior portions of the placenta at the internal os of the cervix, however the fundal patient reports that attached.  Despite vigorous uterine massage and stopping and restarting Pitocin the placenta spontaneously.  Following repair of the perineal laceration which took approximately 30 minutes the decision was made to proceed with manual extraction of the placenta.  The patient's epidural was still working however she was given 1000 mcg of fentanyl and the placenta was then manually removed.'s second sweep was performed and residual piece of placental tissue was removed.  No additional placental tissue remained.  The placenta was inspected.  The cord insertion of the placenta appeared more velamentous in the center of the 2 placental lobes.  1 g of TXA and 2 g of Ancef were administered.  EBL approximately 968 cc.  All sponge, needle, instrument counts were correct.  Mother and baby are doing well  following the delivery.  Anesthesia: Epidural Episiotomy: None Lacerations: 3rd degree Suture Repair: 3.0 vicryl, 0 vicryl Est. Blood Loss (mL): 968  Mom to postpartum.  Baby to Couplet care / Skin to Skin.  Waynard Reeds 03/23/2022, 5:59 PM

## 2021-05-16 ENCOUNTER — Other Ambulatory Visit: Payer: Self-pay

## 2021-05-16 ENCOUNTER — Encounter (HOSPITAL_BASED_OUTPATIENT_CLINIC_OR_DEPARTMENT_OTHER): Payer: Self-pay | Admitting: Obstetrics and Gynecology

## 2021-05-16 DIAGNOSIS — O021 Missed abortion: Secondary | ICD-10-CM

## 2021-05-16 HISTORY — DX: Missed abortion: O02.1

## 2021-05-16 NOTE — Progress Notes (Signed)
Spoke w/ via phone for pre-op interview--- pt Lab needs dos----  no (per anes)/  pre-op orders pending             Lab results------ no COVID test -----patient states asymptomatic no test needed Arrive at ------- 1030 on 05-19-2021 NPO after MN NO Solid Food.  Clear liquids from MN until--- 0930 Med rec completed Medications to take morning of surgery ----- none Diabetic medication ----- n/a Patient instructed no nail polish to be worn day of surgery Patient instructed to bring photo id and insurance card day of surgery Patient aware to have Driver (ride ) / caregiver for 24 hours after surgery --husband, Database administrator ravins Patient Special Instructions ----- n/a Pre-Op special Istructions ----- case just added on today, orders pending Patient verbalized understanding of instructions that were given at this phone interview. Patient denies shortness of breath, chest pain, fever, cough at this phone interview.

## 2021-05-18 ENCOUNTER — Other Ambulatory Visit: Payer: Self-pay | Admitting: Obstetrics and Gynecology

## 2021-05-18 DIAGNOSIS — O021 Missed abortion: Secondary | ICD-10-CM

## 2021-05-19 ENCOUNTER — Ambulatory Visit (HOSPITAL_BASED_OUTPATIENT_CLINIC_OR_DEPARTMENT_OTHER)
Admission: RE | Admit: 2021-05-19 | Discharge: 2021-05-19 | Disposition: A | Discharge status: Home / Self Care | Payer: 59 | Attending: Obstetrics and Gynecology | Admitting: Obstetrics and Gynecology

## 2021-05-19 ENCOUNTER — Ambulatory Visit (HOSPITAL_BASED_OUTPATIENT_CLINIC_OR_DEPARTMENT_OTHER): Payer: 59 | Admitting: Anesthesiology

## 2021-05-19 ENCOUNTER — Encounter (HOSPITAL_BASED_OUTPATIENT_CLINIC_OR_DEPARTMENT_OTHER): Payer: Self-pay | Admitting: Obstetrics and Gynecology

## 2021-05-19 ENCOUNTER — Other Ambulatory Visit: Payer: Self-pay

## 2021-05-19 ENCOUNTER — Encounter (HOSPITAL_BASED_OUTPATIENT_CLINIC_OR_DEPARTMENT_OTHER)
Admission: RE | Disposition: A | Discharge status: Home / Self Care | Payer: Self-pay | Source: Home / Self Care | Attending: Obstetrics and Gynecology

## 2021-05-19 DIAGNOSIS — Z8616 Personal history of COVID-19: Secondary | ICD-10-CM | POA: Insufficient documentation

## 2021-05-19 DIAGNOSIS — O021 Missed abortion: Secondary | ICD-10-CM | POA: Insufficient documentation

## 2021-05-19 HISTORY — PX: DILATION AND EVACUATION: SHX1459

## 2021-05-19 HISTORY — DX: Presence of spectacles and contact lenses: Z97.3

## 2021-05-19 LAB — CBC
HCT: 41 % (ref 36.0–46.0)
Hemoglobin: 13.9 g/dL (ref 12.0–15.0)
MCH: 29.7 pg (ref 26.0–34.0)
MCHC: 33.9 g/dL (ref 30.0–36.0)
MCV: 87.6 fL (ref 80.0–100.0)
Platelets: 220 10*3/uL (ref 150–400)
RBC: 4.68 MIL/uL (ref 3.87–5.11)
RDW: 12.1 % (ref 11.5–15.5)
WBC: 5.3 10*3/uL (ref 4.0–10.5)
nRBC: 0 % (ref 0.0–0.2)

## 2021-05-19 LAB — TYPE AND SCREEN
ABO/RH(D): A NEG
Antibody Screen: NEGATIVE

## 2021-05-19 SURGERY — DILATION AND EVACUATION, UTERUS
Anesthesia: General | Site: Vagina

## 2021-05-19 MED ORDER — ONDANSETRON HCL 4 MG/2ML IJ SOLN
INTRAMUSCULAR | Status: DC | PRN
Start: 2021-05-19 — End: 2021-05-19
  Administered 2021-05-19: 4 mg via INTRAVENOUS

## 2021-05-19 MED ORDER — LACTATED RINGERS IV SOLN: INTRAVENOUS | Status: DC

## 2021-05-19 MED ORDER — IBUPROFEN 800 MG PO TABS: 800.0000 mg | ORAL_TABLET | Freq: Three times a day (TID) | ORAL | 0 refills | Status: DC | PRN

## 2021-05-19 MED ORDER — AMISULPRIDE (ANTIEMETIC) 5 MG/2ML IV SOLN: 10.0000 mg | Freq: Once | INTRAVENOUS | Status: DC | PRN

## 2021-05-19 MED ORDER — KETOROLAC TROMETHAMINE 30 MG/ML IJ SOLN
INTRAMUSCULAR | Status: DC | PRN
Start: 2021-05-19 — End: 2021-05-19
  Administered 2021-05-19: 30 mg via INTRAVENOUS

## 2021-05-19 MED ORDER — FENTANYL CITRATE (PF) 100 MCG/2ML IJ SOLN
INTRAMUSCULAR | Status: AC
  Filled 2021-05-19: qty 2

## 2021-05-19 MED ORDER — LIDOCAINE HCL 1 % IJ SOLN
INTRAMUSCULAR | Status: DC | PRN
  Administered 2021-05-19: 10 mL

## 2021-05-19 MED ORDER — FENTANYL CITRATE (PF) 100 MCG/2ML IJ SOLN
INTRAMUSCULAR | Status: DC | PRN
  Administered 2021-05-19 (×2): 25 ug via INTRAVENOUS
  Administered 2021-05-19: 50 ug via INTRAVENOUS

## 2021-05-19 MED ORDER — PROPOFOL 10 MG/ML IV BOLUS
INTRAVENOUS | Status: DC | PRN
  Administered 2021-05-19: 120 mg via INTRAVENOUS

## 2021-05-19 MED ORDER — MIDAZOLAM HCL 2 MG/2ML IJ SOLN
INTRAMUSCULAR | Status: DC | PRN
  Administered 2021-05-19: 2 mg via INTRAVENOUS

## 2021-05-19 MED ORDER — RHO D IMMUNE GLOBULIN 1500 UNIT/2ML IJ SOSY
300.0000 ug | PREFILLED_SYRINGE | Freq: Once | INTRAMUSCULAR | Status: AC
  Administered 2021-05-19: 300 ug via INTRAVENOUS
  Filled 2021-05-19: qty 2

## 2021-05-19 MED ORDER — FENTANYL CITRATE (PF) 100 MCG/2ML IJ SOLN: 25.0000 ug | INTRAMUSCULAR | Status: DC | PRN

## 2021-05-19 MED ORDER — ACETAMINOPHEN 10 MG/ML IV SOLN: 1000.0000 mg | Freq: Once | INTRAVENOUS | Status: DC | PRN

## 2021-05-19 MED ORDER — LIDOCAINE HCL (CARDIAC) PF 100 MG/5ML IV SOSY
PREFILLED_SYRINGE | INTRAVENOUS | Status: DC | PRN
  Administered 2021-05-19: 60 mg via INTRAVENOUS

## 2021-05-19 MED ORDER — POVIDONE-IODINE 10 % EX SWAB: 2.0000 "application " | Freq: Once | CUTANEOUS | Status: DC

## 2021-05-19 MED ORDER — MIDAZOLAM HCL 2 MG/2ML IJ SOLN
INTRAMUSCULAR | Status: AC
  Filled 2021-05-19: qty 2

## 2021-05-19 MED ORDER — HYDROCODONE-ACETAMINOPHEN 5-325 MG PO TABS: 1.0000 | ORAL_TABLET | Freq: Four times a day (QID) | ORAL | 0 refills | Status: DC | PRN

## 2021-05-19 MED ORDER — DEXAMETHASONE SODIUM PHOSPHATE 4 MG/ML IJ SOLN
INTRAMUSCULAR | Status: DC | PRN
  Administered 2021-05-19: 8 mg via INTRAVENOUS

## 2021-05-19 SURGICAL SUPPLY — 29 items
CATH ROBINSON RED A/P 14FR (CATHETERS) ×2 IMPLANT
DECANTER SPIKE VIAL GLASS SM (MISCELLANEOUS) ×2 IMPLANT
DILATOR CANAL MILEX (MISCELLANEOUS) IMPLANT
DRSG TELFA 3X8 NADH (GAUZE/BANDAGES/DRESSINGS) ×2 IMPLANT
FILTER UTR ASPR ASSEMBLY (MISCELLANEOUS) IMPLANT
GAUZE 4X4 16PLY ~~LOC~~+RFID DBL (SPONGE) ×2 IMPLANT
GLOVE SURG ENC MOIS LTX SZ7 (GLOVE) ×2 IMPLANT
GLOVE SURG UNDER POLY LF SZ6.5 (GLOVE) ×2 IMPLANT
GLOVE SURG UNDER POLY LF SZ7 (GLOVE) ×2 IMPLANT
GLOVE SURG UNDER POLY LF SZ7.5 (GLOVE) ×1 IMPLANT
GOWN STRL REUS W/TWL LRG LVL3 (GOWN DISPOSABLE) ×4 IMPLANT
HOSE CONNECTING 18IN BERKELEY (TUBING) IMPLANT
KIT BERKELEY 1ST TRI 3/8 NO TR (MISCELLANEOUS) ×2 IMPLANT
KIT BERKELEY 1ST TRIMESTER 3/8 (MISCELLANEOUS) ×2 IMPLANT
KIT TURNOVER CYSTO (KITS) ×2 IMPLANT
NS IRRIG 1000ML POUR BTL (IV SOLUTION) ×2 IMPLANT
PACK VAGINAL MINOR WOMEN LF (CUSTOM PROCEDURE TRAY) ×2 IMPLANT
PAD DRESSING TELFA 3X8 NADH (GAUZE/BANDAGES/DRESSINGS) ×1 IMPLANT
PAD OB MATERNITY 4.3X12.25 (PERSONAL CARE ITEMS) ×2 IMPLANT
PAD PREP 24X48 CUFFED NSTRL (MISCELLANEOUS) ×2 IMPLANT
PANTS MESH DISP 2XL (UNDERPADS AND DIAPERS) ×1 IMPLANT
PANTS MESH DISPOSABLE 2XL (UNDERPADS AND DIAPERS) ×1
SET BERKELEY SUCTION TUBING (SUCTIONS) ×3 IMPLANT
SOL TOPI POVIDONE IODINE PAINT (MISCELLANEOUS) ×2 IMPLANT
TRAP TISSUE FILTER (MISCELLANEOUS) IMPLANT
VACURETTE 10 RIGID CVD (CANNULA) IMPLANT
VACURETTE 7MM CVD STRL WRAP (CANNULA) ×1 IMPLANT
VACURETTE 8 RIGID CVD (CANNULA) IMPLANT
VACURETTE 9 RIGID CVD (CANNULA) IMPLANT

## 2021-05-19 NOTE — Anesthesia Procedure Notes (Addendum)
Procedure Name: LMA Insertion Date/Time: 05/19/2021 12:14 PM Performed by: Earmon Phoenix, CRNA Pre-anesthesia Checklist: Patient identified, Emergency Drugs available, Suction available, Patient being monitored and Timeout performed Patient Re-evaluated:Patient Re-evaluated prior to induction Oxygen Delivery Method: Circle system utilized Preoxygenation: Pre-oxygenation with 100% oxygen Induction Type: IV induction Ventilation: Mask ventilation without difficulty LMA: LMA inserted LMA Size: 3.0 Number of attempts: 1 Placement Confirmation: positive ETCO2, CO2 detector and breath sounds checked- equal and bilateral Tube secured with: Tape Dental Injury: Teeth and Oropharynx as per pre-operative assessment

## 2021-05-19 NOTE — H&P (Signed)
Holly Holder is an 36 y.o. female.   36 yo G5P2012 presents for surgical management of a 7 week missed miscarriage. Recent US in the office confirmed no fetal cardiac activity. Management options discussed. Pt desires surgical management. R/B/A reviewed and patient wishes to proceed    Past Medical History:  Diagnosis Date   History of COVID-19 04/2020   per pt mild symptoms that resolved   Missed ab 05/16/2021   Wears contact lenses     Past Surgical History:  Procedure Laterality Date   DILATION AND EVACUATION N/A 12/20/2016   Procedure: DILATATION AND EVACUATION;  Surgeon: Vanessa Kick, MD;  Location: [facility];  Service: Gynecology;  Laterality: N/A;    History reviewed. No pertinent family history.  Social History:  reports that she has never smoked. She has never used smokeless tobacco. She reports that she does not drink alcohol and does not use drugs.  Allergies: No Known Allergies  No medications prior to admission.    Review of Systems  Blood pressure 117/71, pulse 80, temperature 98.8 F (37.1 C), temperature source Oral, resp. rate 15, height 5' 4.17" (1.63 m), weight 57.2 kg, last menstrual period 03/14/2021, SpO2 100 %, unknown if currently breastfeeding. Physical Exam  AOx3, NAD Normal work of breathing Abd soft   No results found for this or any previous visit (from the past 24 hour(s)).  No results found.  Assessment/Plan: 1) Admit 2) SCDs 3) Proceed with suction D&E  Vanessa Kick 05/19/2021, 11:27 AM

## 2021-05-19 NOTE — Anesthesia Postprocedure Evaluation (Signed)
Anesthesia Post Note  Patient: Holly Holder  Procedure(s) Performed: SUCTION DILATION & CURETTAGE (Vagina )     Patient location during evaluation: PACU Anesthesia Type: General Level of consciousness: awake and alert Pain management: pain level controlled Vital Signs Assessment: post-procedure vital signs reviewed and stable Respiratory status: spontaneous breathing, nonlabored ventilation, respiratory function stable and patient connected to nasal cannula oxygen Cardiovascular status: blood pressure returned to baseline and stable Postop Assessment: no apparent nausea or vomiting Anesthetic complications: no   No notable events documented.  Last Vitals:  Vitals:   05/19/21 1345 05/19/21 1400  BP: (!) 92/56 93/62  Pulse: 63 65  Resp: 16 11  Temp:    SpO2: 99% 98%    Last Pain:  Vitals:   05/19/21 1345  TempSrc:   PainSc: 0-No pain                 Belenda Cruise P Adan Beal

## 2021-05-19 NOTE — Transfer of Care (Signed)
Immediate Anesthesia Transfer of Care Note  Patient: Holly Holder  Procedure(s) Performed: SUCTION DILATION & CURETTAGE (Vagina )  Patient Location: PACU  Anesthesia Type:General  Level of Consciousness: awake, alert , oriented and patient cooperative  Airway & Oxygen Therapy: Patient Spontanous Breathing  Post-op Assessment: Report given to RN and Post -op Vital signs reviewed and stable  Post vital signs: Reviewed and stable  Last Vitals:  Vitals Value Taken Time  BP 99/61 05/19/21 1245  Temp    Pulse 76 05/19/21 1245  Resp 14 05/19/21 1245  SpO2 100 % 05/19/21 1245  Vitals shown include unvalidated device data.  Last Pain:  Vitals:   05/19/21 1054  TempSrc: Oral  PainSc: 0-No pain      Patients Stated Pain Goal: 5 (05/19/21 1054)  Complications: No notable events documented.

## 2021-05-19 NOTE — Anesthesia Preprocedure Evaluation (Signed)
Anesthesia Evaluation  Patient identified by MRN, date of birth, ID band Patient awake    Reviewed: Allergy & Precautions, NPO status , Patient's Chart, lab work & pertinent test results  Airway Mallampati: I  TM Distance: >3 FB Neck ROM: Full    Dental no notable dental hx.    Pulmonary neg pulmonary ROS,    Pulmonary exam normal        Cardiovascular negative cardio ROS   Rhythm:Regular Rate:Normal     Neuro/Psych negative neurological ROS  negative psych ROS   GI/Hepatic negative GI ROS, Neg liver ROS,   Endo/Other  negative endocrine ROS  Renal/GU negative Renal ROS  negative genitourinary   Musculoskeletal negative musculoskeletal ROS (+)   Abdominal Normal abdominal exam  (+)   Peds  Hematology negative hematology ROS (+)   Anesthesia Other Findings   Reproductive/Obstetrics Missed ab                             Anesthesia Physical Anesthesia Plan  ASA: 1  Anesthesia Plan: General   Post-op Pain Management:    Induction: Intravenous  PONV Risk Score and Plan: 3 and Ondansetron, Dexamethasone, Midazolam and Treatment may vary due to age or medical condition  Airway Management Planned: Mask and LMA  Additional Equipment: None  Intra-op Plan:   Post-operative Plan: Extubation in OR  Informed Consent: I have reviewed the patients History and Physical, chart, labs and discussed the procedure including the risks, benefits and alternatives for the proposed anesthesia with the patient or authorized representative who has indicated his/her understanding and acceptance.     Dental advisory given  Plan Discussed with: CRNA  Anesthesia Plan Comments:         Anesthesia Quick Evaluation

## 2021-05-19 NOTE — Op Note (Signed)
Pre-Operative Diagnosis: 1) 7 week missed miscarriage Postoperative Diagnosis: 1) 7 week missed miscarriage Procedure: Suction dilation and evacuation Surgeon: Dr. Waynard Reeds Assistant: None Operative Findings: Retroverted uterus, 8 week size.  Specimen: Productions of conception EBL: Total I/O In: 300 [I.V.:300] Out: 50 [Blood:50]   Holly Holder Is a 36 year old gravida 5 para 2012 who presents for definitive surgical management for 7 week missed miscarriage. Please see the patient's history and physical for complete details of the history. Management options were discussed with the patient. R/B/A reviewed. Following appropriate informed consent was taken to the operating room. The patient was appropriately identified during a time out procedure. General anesthesia was administered and the patient was placed in the dorsal lithotomy position. The patient was prepped and draped in the normal sterile fashion. A speculum was placed into the vagina, a single-tooth tenaculum was placed on the anterior lip of the cervix, and 10 cc of 1% lidocaine was administered in a paracervical fashion. The cervix was serially dilated with Hank dilators. . A #7 suction curet was then passed to the fundus, the vacuum was engaged, and 3 suction passes were performed with a curette. A Sharp curettage was performed and a gritty texture was noted. A final suction pass was performed with minimal results. This completed the procedure. The patient tolerated the procedure well was brought to the recovery room in stable condition for the procedure. All sponge and needle counts correct x2.

## 2021-05-19 NOTE — Discharge Instructions (Signed)

## 2021-05-21 LAB — RH IG WORKUP (INCLUDES ABO/RH)
Gestational Age(Wks): 7
Unit division: 0

## 2021-05-22 ENCOUNTER — Encounter (HOSPITAL_BASED_OUTPATIENT_CLINIC_OR_DEPARTMENT_OTHER): Payer: Self-pay | Admitting: Obstetrics and Gynecology

## 2021-05-22 LAB — SURGICAL PATHOLOGY

## 2021-08-06 ENCOUNTER — Inpatient Hospital Stay (HOSPITAL_COMMUNITY): Payer: 59

## 2021-08-06 ENCOUNTER — Encounter (HOSPITAL_COMMUNITY): Payer: Self-pay | Admitting: Obstetrics and Gynecology

## 2021-08-06 ENCOUNTER — Inpatient Hospital Stay (HOSPITAL_COMMUNITY)
Admission: AD | Admit: 2021-08-06 | Discharge: 2021-08-06 | Disposition: A | Discharge status: Home / Self Care | Payer: 59 | Attending: Obstetrics and Gynecology | Admitting: Obstetrics and Gynecology

## 2021-08-06 ENCOUNTER — Other Ambulatory Visit: Payer: Self-pay

## 2021-08-06 DIAGNOSIS — O208 Other hemorrhage in early pregnancy: Secondary | ICD-10-CM | POA: Diagnosis not present

## 2021-08-06 DIAGNOSIS — O468X1 Other antepartum hemorrhage, first trimester: Secondary | ICD-10-CM

## 2021-08-06 DIAGNOSIS — Z3A01 Less than 8 weeks gestation of pregnancy: Secondary | ICD-10-CM

## 2021-08-06 DIAGNOSIS — O09521 Supervision of elderly multigravida, first trimester: Secondary | ICD-10-CM | POA: Insufficient documentation

## 2021-08-06 DIAGNOSIS — Z3491 Encounter for supervision of normal pregnancy, unspecified, first trimester: Secondary | ICD-10-CM

## 2021-08-06 DIAGNOSIS — O209 Hemorrhage in early pregnancy, unspecified: Secondary | ICD-10-CM

## 2021-08-06 LAB — WET PREP, GENITAL
Clue Cells Wet Prep HPF POC: NONE SEEN
Sperm: NONE SEEN
Trich, Wet Prep: NONE SEEN
WBC, Wet Prep HPF POC: <10 (ref <10)
Yeast Wet Prep HPF POC: NONE SEEN

## 2021-08-06 LAB — CBC
HCT: 36.3 % (ref 36.0–46.0)
Hemoglobin: 12.1 g/dL (ref 12.0–15.0)
MCH: 29.3 pg (ref 26.0–34.0)
MCHC: 33.3 g/dL (ref 30.0–36.0)
MCV: 87.9 fL (ref 80.0–100.0)
Platelets: 188 10*3/uL (ref 150–400)
RBC: 4.13 MIL/uL (ref 3.87–5.11)
RDW: 12.4 % (ref 11.5–15.5)
WBC: 4.8 10*3/uL (ref 4.0–10.5)
nRBC: 0 % (ref 0.0–0.2)

## 2021-08-06 LAB — URINALYSIS, ROUTINE W REFLEX MICROSCOPIC
Bilirubin Urine: NEGATIVE
Glucose, UA: NEGATIVE mg/dL
Ketones, ur: NEGATIVE mg/dL
Leukocytes,Ua: NEGATIVE
Nitrite: NEGATIVE
Protein, ur: NEGATIVE mg/dL
Specific Gravity, Urine: 1.002 — ABNORMAL LOW (ref 1.005–1.030)
pH: 7 (ref 5.0–8.0)

## 2021-08-06 LAB — POCT PREGNANCY, URINE: Preg Test, Ur: POSITIVE — AB

## 2021-08-06 LAB — HCG, QUANTITATIVE, PREGNANCY: hCG, Beta Chain, Quant, S: 46582 m[IU]/mL — ABNORMAL HIGH (ref <5)

## 2021-08-06 MED ORDER — RHO D IMMUNE GLOBULIN 1500 UNIT/2ML IJ SOSY
300.0000 ug | PREFILLED_SYRINGE | Freq: Once | INTRAMUSCULAR | Status: AC
  Administered 2021-08-06: 300 ug via INTRAMUSCULAR
  Filled 2021-08-06: qty 2

## 2021-08-06 NOTE — MAU Note (Addendum)
Holly Holder is a 36 y.o. at Unknown here in MAU reporting: heavy VB that began today, approx 45 minutes ago.  Reports is wearing a sanitary napkin, half saturated.  Denies passing clots or abdominal pain/cramping.  Last intercourse was a few days ago. ?LMP: 06/21/2021 ?Onset of complaint: today ?Pain score: 1/10 abdomen (tender) ?Vitals:  ? 08/06/21 1233  ?BP: 115/67  ?Pulse: 77  ?Temp: 98 ?F (36.7 ?C)  ?SpO2: 100%  ?   ?FHT:N/A ?Lab orders placed from triage:   UPT/UA ?

## 2021-08-06 NOTE — MAU Provider Note (Signed)
Chief Complaint: Vaginal Bleeding ? ? Event Date/Time  ? First Provider Initiated Contact with Patient 08/06/21 1346   ?  ? ?SUBJECTIVE ?HPI: Holly Holder is a 36 y.o. [id number] at 5946w4d by LMP who presents to maternity admissions reporting onset of heavy vaginal bleeding at home this morning. She is wearing a pad and has soaked 1/2 pad in ~ 45 minutes with dark red bleeding.  There is no pain. ? ?HPI ? ?Past Medical History:  ?Diagnosis Date  ? History of COVID-19 04/2020  ? per pt mild symptoms that resolved  ? Missed ab 05/16/2021  ? Wears contact lenses   ? ?Past Surgical History:  ?Procedure Laterality Date  ? DILATION AND EVACUATION N/A 12/20/2016  ? Procedure: DILATATION AND EVACUATION;  Surgeon: Waynard Reedsoss, Kendra, MD;  Location: [facility];  Service: Gynecology;  Laterality: N/A;  ? DILATION AND EVACUATION N/A 05/19/2021  ? Procedure: SUCTION DILATION & CURETTAGE;  Surgeon: Waynard Reedsoss, Kendra, MD;  [facility];  Service: Gynecology;  Laterality: N/A;  ? ?Social History  ? ?Socioeconomic History  ? Marital status: Married  ?  Spouse name: Not on file  ? Number of children: Not on file  ? Years of education: Not on file  ? Highest education level: Not on file  ?Occupational History  ? Not on file  ?Tobacco Use  ? Smoking status: Never  ? Smokeless tobacco: Never  ?Vaping Use  ? Vaping Use: Never used  ?Substance and Sexual Activity  ? Alcohol use: No  ? Drug use: No  ? Sexual activity: Yes  ?  Birth control/protection: None  ?Other Topics Concern  ? Not on file  ?Social History Narrative  ? Not on file  ? ?Social Determinants of Health  ? ?Financial Resource Strain: Not on file  ?Food Insecurity: Not on file  ?Transportation Needs: Not on file  ?Physical Activity: Not on file  ?Stress: Not on file  ?Social Connections: Not on file  ?Intimate Partner Violence: Not on file  ? ?No current facility-administered medications on file prior to encounter.  ? ?Current Outpatient Medications on File Prior to  Encounter  ?Medication Sig Dispense Refill  ? HYDROcodone-acetaminophen (NORCO/VICODIN) 5-325 MG tablet Take 1 tablet by mouth every 6 (six) hours as needed for moderate pain. 10 tablet 0  ? ibuprofen (ADVIL) 800 MG tablet Take 1 tablet (800 mg total) by mouth every 8 (eight) hours as needed. 30 tablet 0  ? ?No Known Allergies ? ?ROS:  ?Review of Systems  ?Constitutional:  Negative for chills, fatigue and fever.  ?Respiratory:  Negative for shortness of breath.   ?Cardiovascular:  Negative for chest pain.  ?Gastrointestinal:  Negative for abdominal pain, nausea and vomiting.  ?Genitourinary:  Positive for vaginal bleeding. Negative for difficulty urinating, dysuria, flank pain, pelvic pain, vaginal discharge and vaginal pain.  ?Neurological:  Negative for dizziness and headaches.  ?Psychiatric/Behavioral: Negative.    ? ? ?I have reviewed patient's Past Medical Hx, Surgical Hx, Family Hx, Social Hx, medications and allergies.  ? ?Physical Exam  ?Patient Vitals for the past 24 hrs: ? BP Temp Temp src Pulse SpO2 Height Weight  ?08/06/21 1252 115/67 -- -- 82 -- -- --  ?08/06/21 1233 115/67 98 ?F (36.7 ?C) Oral 77 100 % -- --  ?08/06/21 1225 -- -- -- -- -- 5' 4.5" (1.638 m) 57.1 kg  ? ?Constitutional: Well-developed, well-nourished female in no acute distress.  ?Cardiovascular: normal rate ?Respiratory: normal effort ?GI: Abd soft, non-tender. Pos  BS x 4 ?MS: Extremities nontender, no edema, normal ROM ?Neurologic: Alert and oriented x 4.  ?GU: Neg CVAT. ? ?PELVIC EXAM: Cervix pink, visually closed, without lesion, moderate amount dark red bleeding with small clots, 2 fox swabs used to visualized cervix, vaginal walls and external genitalia normal ? ? ?LAB RESULTS ?Results for orders placed or performed during the hospital encounter of 08/06/21 (from the past 24 hour(s))  ?Pregnancy, urine POC     Status: Abnormal  ? Collection Time: 08/06/21 12:29 PM  ?Result Value Ref Range  ? Preg Test, Ur POSITIVE (A) NEGATIVE   ?Urinalysis, Routine w reflex microscopic Urine, Clean Catch     Status: Abnormal  ? Collection Time: 08/06/21 12:37 PM  ?Result Value Ref Range  ? Color, Urine STRAW (A) YELLOW  ? APPearance CLEAR CLEAR  ? Specific Gravity, Urine 1.002 (L) 1.005 - 1.030  ? pH 7.0 5.0 - 8.0  ? Glucose, UA NEGATIVE NEGATIVE mg/dL  ? Hgb urine dipstick LARGE (A) NEGATIVE  ? Bilirubin Urine NEGATIVE NEGATIVE  ? Ketones, ur NEGATIVE NEGATIVE mg/dL  ? Protein, ur NEGATIVE NEGATIVE mg/dL  ? Nitrite NEGATIVE NEGATIVE  ? Leukocytes,Ua NEGATIVE NEGATIVE  ? RBC / HPF 0-5 0 - 5 RBC/hpf  ? WBC, UA 0-5 0 - 5 WBC/hpf  ? Bacteria, UA RARE (A) NONE SEEN  ? Squamous Epithelial / LPF 0-5 0 - 5  ?CBC     Status: None  ? Collection Time: 08/06/21  1:12 PM  ?Result Value Ref Range  ? WBC 4.8 4.0 - 10.5 K/uL  ? RBC 4.13 3.87 - 5.11 MIL/uL  ? Hemoglobin 12.1 12.0 - 15.0 g/dL  ? HCT 36.3 36.0 - 46.0 %  ? MCV 87.9 80.0 - 100.0 fL  ? MCH 29.3 26.0 - 34.0 pg  ? MCHC 33.3 30.0 - 36.0 g/dL  ? RDW 12.4 11.5 - 15.5 %  ? Platelets 188 150 - 400 K/uL  ? nRBC 0.0 0.0 - 0.2 %  ?Rh IG workup (includes ABO/Rh)     Status: None (Preliminary result)  ? Collection Time: 08/06/21  1:12 PM  ?Result Value Ref Range  ? Gestational Age(Wks) 6   ? ABO/RH(D)    ?  A NEG ?Performed at [facility], [address], [place], [place] [address] ?  ? Antibody Screen PENDING   ?Wet prep, genital     Status: None  ? Collection Time: 08/06/21  1:22 PM  ?Result Value Ref Range  ? Yeast Wet Prep HPF POC NONE SEEN NONE SEEN  ? Trich, Wet Prep NONE SEEN NONE SEEN  ? Clue Cells Wet Prep HPF POC NONE SEEN NONE SEEN  ? WBC, Wet Prep HPF POC <10 <10  ? Sperm NONE SEEN   ? ? ?--/--/A NEG ?Performed at [facility], [address] [place], [place] [address] ? (04/23 1312) ? ?IMAGING ?No results found. ? ?MAU Management/MDM: ?Orders Placed This Encounter  ?Procedures  ? Wet prep, genital  ? US OB LESS THAN 14 WEEKS WITH OB TRANSVAGINAL  ? Urinalysis, Routine w reflex microscopic  Urine, Clean Catch  ? CBC  ? hCG, quantitative, pregnancy  ? Pregnancy, urine POC  ? Rh IG workup (includes ABO/Rh)  ?  ?No orders of the defined types were placed in this encounter. ?  ?Viable IUP on [place], small [facility] noted by sonographer but not read by radiologist. Discussed [facility] with patient as possible cause of bleeding.  Bleeding/return precautions reviewed.  F/U as scheduled for prenatal care.   ? ?ASSESSMENT ? ?1. Subchorionic hemorrhage of placenta in first trimester, single or unspecified fetus   ?2. Vaginal bleeding in pregnancy, first trimester   ?3. Normal IUP (intrauterine pregnancy) on prenatal ultrasound, first trimester   ?4. [redacted] weeks gestation of pregnancy   ?  ?PLAN ?Discharge home ? ? ? ?Misty Stanley Leftwich-Kirby ?Certified Nurse-Midwife ?08/06/2021  ?1:56 PM ? ? ?  ?

## 2021-08-07 LAB — RH IG WORKUP (INCLUDES ABO/RH)
ABO/RH(D): A NEG
Antibody Screen: POSITIVE
Gestational Age(Wks): 6
Unit division: 0

## 2021-08-07 LAB — GC/CHLAMYDIA PROBE AMP (~~LOC~~) NOT AT ARMC
Chlamydia: NEGATIVE
Comment: NEGATIVE
Comment: NORMAL
Neisseria Gonorrhea: NEGATIVE

## 2021-08-12 ENCOUNTER — Inpatient Hospital Stay (HOSPITAL_COMMUNITY)
Admission: AD | Admit: 2021-08-12 | Discharge: 2021-08-12 | Disposition: A | Discharge status: Home / Self Care | Payer: 59 | Attending: Obstetrics and Gynecology | Admitting: Obstetrics and Gynecology

## 2021-08-12 ENCOUNTER — Encounter (HOSPITAL_COMMUNITY): Payer: Self-pay | Admitting: Obstetrics and Gynecology

## 2021-08-12 DIAGNOSIS — Z3A01 Less than 8 weeks gestation of pregnancy: Secondary | ICD-10-CM | POA: Insufficient documentation

## 2021-08-12 DIAGNOSIS — O209 Hemorrhage in early pregnancy, unspecified: Secondary | ICD-10-CM | POA: Insufficient documentation

## 2021-08-12 HISTORY — DX: Other specified health status: Z78.9

## 2021-08-12 NOTE — MAU Note (Signed)
Holly Holder is a 36 y.o. at [redacted]w[redacted]d here in MAU reporting: an hour and a half ago started dripping blood, sat on the toilet for a while- just continued to drip. Was here for the same last Sunday (was told subchorionic hemorrhage). Passed a clot, 1/2 x3 in.  Stills like still having a little bit now. Tenderness  in lower abd. ? ?Onset of complaint: this morning. ?Pain score: mild and less ?Vitals:  ? 08/12/21 1228  ?BP: 113/68  ?Pulse: 70  ?Resp: 16  ?Temp: 99.3 ?F (37.4 ?C)  ?SpO2: 100%  ?   ? ?Lab orders placed from triage:  none ?

## 2021-08-12 NOTE — MAU Provider Note (Addendum)
History  ? ? Event Date/Time  ? First Provider Initiated Contact with Patient 08/12/21 1410   ?  ? ?Chief Complaint:  No chief complaint on file. ? ? ?Holly Holder is  36 y.o. [id number] Patient's last menstrual period was 06/21/2021.Marland Kitchen Patient is 7 weeks 3 days by LMP and 6-week ultrasound here for f worsening vaginal bleeding.  She was seen in MAU a week ago with bleeding thought to be due to subchorionic hemorrhage.  Live IUP confirmed .received Rhophylac. ? ?ROS ?Abdominal Pain: Denies ?Vaginal bleeding: similar to period and passing medium clot.  Had moderate bleeding when using the bathroom and was not sure if anything passed into the toilet. ?Passage of clots or tissue: + ?Dizziness: Denies ? ?A NEG ? ? ? ?Physical Exam  ? ?Patient Vitals for the past 24 hrs: ? BP Temp Temp src Pulse Resp SpO2 Height Weight  ?08/12/21 1434 111/68 -- -- 72 -- -- -- --  ?08/12/21 1228 113/68 99.3 ?F (37.4 ?C) Oral 70 16 100 % 5' 4.5" (1.638 m) 57.7 kg  ? ?Constitutional: Well-nourished female in no apparent distress. No pallor ?Neuro: Alert and oriented ?4 ?Cardiovascular: Normal rate ?Respiratory: Normal effort and rate ?Abdomen: Soft, nontender ?Gynecological Exam: Normal external female genitalia.  Small amount of dark red blood in vaginal vault.  Cervix visually closed.  Normal ectropion cervix nonfriable.  No mucopurulent discharge or polyps. ? ?Labs: ?No results found for this or any previous visit (from the past 24 hour(s)). ? ?Ultrasound Studies:   ?Pt informed that the ultrasound is considered a limited OB ultrasound and is not intended to be a complete ultrasound exam.  Patient also informed that the ultrasound is not being completed with the intent of assessing for fetal or placental anomalies or any pelvic abnormalities.  Explained that the purpose of today?s ultrasound is to assess for  viability.  Patient acknowledges the purpose of the exam and the limitations of the study.   ?Fetal heart rate 156. ? ?MAU  course/MDM: ?Vaginal bleeding in pregnancy likely due to subchorionic hemorrhage.  Positive cardiac activity on informal bedside ultrasound.  Patient reassured. ? ?Assessment: ?1. Vaginal bleeding in pregnancy, first trimester   ?2. [redacted] weeks gestation of pregnancy   ? ? ?Plan: ?Discharge home in stable condition. ?Bleeding in first trimester precautions ? Follow-up Information   ? ? Law, Cassandra A, DO Follow up on 08/24/2021.   ?Specialty: Obstetrics and Gynecology ?Why: To start prenatal care or sooner as needed if symptoms worsen ?Contact information: ?[address] ?[place] [address] ?[phone] ? ? ?  ?  ? ? [facility] Follow up.   ?Specialty: Obstetrics and Gynecology ?Why: As needed in emergencies, As needed if symptoms worsen ?Contact information: ?[address] ?[id number] mc ?[place] [address] ?[phone] ? ?  ?  ? ?  ?  ? ?  ? ?Allergies as of 08/12/2021   ?No Known Allergies ?  ? ?  ?Medication List  ?  ?You have not been prescribed any medications. ?  ? ? ?Katrinka Blazing, [place], CNM ?08/12/2021, 3:41 PM ? ?2/3 ? ?

## 2021-08-24 LAB — HEPATITIS C ANTIBODY: HCV Ab: NEGATIVE

## 2021-08-24 LAB — OB RESULTS CONSOLE ANTIBODY SCREEN: Antibody Screen: POSITIVE

## 2021-08-24 LAB — OB RESULTS CONSOLE HIV ANTIBODY (ROUTINE TESTING): HIV: NONREACTIVE

## 2021-08-24 LAB — OB RESULTS CONSOLE RPR: RPR: NONREACTIVE

## 2021-08-24 LAB — OB RESULTS CONSOLE RUBELLA ANTIBODY, IGM: Rubella: IMMUNE

## 2021-08-24 LAB — OB RESULTS CONSOLE HEPATITIS B SURFACE ANTIGEN: Hepatitis B Surface Ag: NEGATIVE

## 2021-08-24 LAB — OB RESULTS CONSOLE ABO/RH: RH Type: NEGATIVE

## 2021-08-31 ENCOUNTER — Other Ambulatory Visit: Payer: Self-pay

## 2021-09-05 ENCOUNTER — Other Ambulatory Visit: Payer: Self-pay | Admitting: Obstetrics and Gynecology

## 2021-09-12 ENCOUNTER — Encounter: Payer: Self-pay | Admitting: Obstetrics and Gynecology

## 2021-09-14 ENCOUNTER — Ambulatory Visit: Payer: 59 | Attending: Maternal & Fetal Medicine | Admitting: Obstetrics and Gynecology

## 2021-09-14 ENCOUNTER — Other Ambulatory Visit: Payer: Self-pay

## 2021-09-14 ENCOUNTER — Ambulatory Visit: Payer: Self-pay

## 2021-09-14 DIAGNOSIS — O09521 Supervision of elderly multigravida, first trimester: Secondary | ICD-10-CM

## 2021-09-14 DIAGNOSIS — Z3A12 12 weeks gestation of pregnancy: Secondary | ICD-10-CM | POA: Diagnosis not present

## 2021-09-14 NOTE — Progress Notes (Signed)
Virtual Visit via Video Note  I connected with Holly Holder on 09/14/21 at  9:00 AM EDT by a video enabled telemedicine application and verified that I am speaking with the correct person using two identifiers.  Location: Patient: Work  Provider: [facility] [place]   I discussed the limitations of evaluation and management by telemedicine and the availability of in person appointments. The patient expressed understanding and agreed to proceed.  Referring Provider:  Vanessa Kick, [facility] Length of Consultation: 50 minutes  Holly Holder was referred to Maternal Fetal Care at [facility] for genetic counseling because of advanced maternal age.  The patient will be 36 years old at the time of delivery.  This note summarizes the information we discussed.  The patient was seen at this visit alone via Webex.  We explained that the chance of a chromosome abnormality increases with maternal age.  Chromosomes and examples of chromosome problems were reviewed.  Humans typically have 46 chromosomes in each cell, with half passed through each sperm and egg.  Any change in the number or structure of chromosomes can increase the risk of problems in the physical and mental development of a pregnancy.   Based upon age of the patient, the chance of any chromosome abnormality was 1 in 3. The chance of Down syndrome, the most common chromosome problem associated with maternal age, was 1 in 69.  The risk of chromosome problems is in addition to the 3% general population risk for birth defects and intellectual disabilities.  The greatest chance, of course, is that the baby would be born in good health.  We discussed the following prenatal screening and testing options for this pregnancy:  Cell free fetal DNA testing analyzes maternal blood to determine whether or not the baby may have Down syndrome, trisomy 18, or trisomy 76.  This test utilizes a maternal  blood sample and DNA sequencing technology to isolate circulating cell free fetal DNA from maternal plasma.  The fetal DNA can then be analyzed for DNA sequences that are derived from the three most common chromosomes involved in aneuploidy, chromosomes 13, 18, and 21.  If the overall amount of DNA is greater than the expected level for any of these chromosomes, aneuploidy is suspected.  While we do not consider it a replacement for invasive testing and karyotype analysis, a negative result from this testing would be reassuring, though not a guarantee of a normal chromosome complement for the baby.  An abnormal result is certainly suggestive of an abnormal chromosome complement, though we would still recommend CVS or amniocentesis to confirm any findings from this testing. Holly Holder had prior Panorama testing in this pregnancy which showed a low risk for Trisomy 38, 29, 81, Turner syndrome and Triploidy as well as predicted a female fetus.    The chorionic villus sampling procedure is available for first trimester chromosome analysis.  This involves the withdrawal of a small amount of chorionic villi (tissue from the developing placenta).  Risk of pregnancy loss is estimated to be approximately 1 in 200 to 1 in 100 (0.5 to 1%).  There is approximately a 1% (1 in 100) chance that the CVS chromosome results will be unclear.  Chorionic villi cannot be tested for neural tube defects.     Maternal serum marker screening, a blood test that measures pregnancy proteins, can provide risk assessments for Down syndrome, trisomy 18, and open neural tube defects (spina bifida, anencephaly). Because it does  not directly examine the fetus, it cannot positively diagnose or rule out these problems. The detection rate is approximately 75% for Down syndrome, 70% for trisomy 18 and 80% of open neural tube defects. Given the normal Panorama, we would recommend AFP only to test for open neural tube defects alone. However, if  amniocentesis is performed, the AFP will be done on that sample.  Targeted ultrasound uses high frequency sound waves to create an image of the developing fetus.  An ultrasound is often recommended as a routine means of evaluating the pregnancy.  It is also used to screen for fetal anatomy problems (for example, a heart defect) that might be suggestive of a chromosomal or other abnormality.   Amniocentesis involves the removal of a small amount of amniotic fluid from the sac surrounding the fetus with the use of a thin needle inserted through the maternal abdomen and uterus.  Ultrasound guidance is used throughout the procedure.  Fetal cells from amniotic fluid are directly evaluated and > 99.5% of structural and numerical chromosome problems and > 98% of open neural tube defects can be detected. Chromosomal microarray may also be ordered to assess for deletions or duplications of genetic material. This procedure is generally performed after the 15th week of pregnancy.  The main risks to this procedure include complications leading to miscarriage in less than 1 in 200 cases (0.5%). We would typically offer chromosome analysis, amniotic fluid AFP testing and chromosomal microarray testing on the amniotic fluid sample.  We briefly talked about carrier screening for cystic fibrosis (CF), hemoglobinopathies and Spinal Muscular Atrophy (SMA).  The patient stated that she and her husband had "genetic testing and screening" performed in [place] prior to their first pregnancy.  She recalls that the testing included conditions that they may be carriers for and that the results were all normal.  I offered to review those records if she has a copy of them.  Otherwise, she has declined additional carrier screening at this time.  We obtained a detailed family history and pregnancy history.  This is the fifth pregnancy for this couple.  They have a 51 year old son and a 74 year old daughter who are both reported to be in  good health.  She had two first trimester miscarriages previously with no genetic testing. In the current pregnancy, the patient reported no complications and no exposure to medications, alcohol, tobacco or recreational drugs.  The patient also stated that her mother had three first trimester miscarriages. We reviewed that there may be many reasons for recurrent pregnancy loss including chromosome translocations, clotting disorders, immune conditions and structural uterine anomalies, though with only two losses for herself we would not typically pursue additional testing at this time. The remainder of the family history is unremarkable for birth defects, developmental delays, recurrent pregnancy loss or known chromosome abnormalities.  Plan of Care: The patient desires amniocentesis for prenatal diagnosis of chromosome conditions due to maternal age. Holly Holder is traveling back to [place] from June 14-July 29, which is from 15 weeks to almost [redacted] weeks gestation.  She has made arrangements for an amniocentesis in [place] on July 18 (at [redacted]w[redacted]d).  We discussed that this will allow for earlier results than waiting until she is back in the [place] and may still allow for options regarding possible termination if there were abnormalities noted. She is aware that this would most likely require travel to [place]. I am happy to assist with conveying results from the amniocentesis if needed.  The patient was given my email to reach out while she is out of the country or have results sent to when they are available ([email or web address]). Plan for a detailed anatomy ultrasound either at her OB office or at [facility] to be scheduled prior to her leaving the country so that she can have it as soon as she returns. The patient stated that her OB will do a 13 week ultrasound prior to leaving. We are happy to review records from her prior "genetic screening" in [place] if they are available. She declined  carrier testing at this time.  Holly Holder was encouraged to call with questions or concerns.  We can be contacted at [phone] or [phone].  I provided 50 minutes of non-face-to-face time during this encounter.   Donette Larry

## 2021-09-19 LAB — OB RESULTS CONSOLE GC/CHLAMYDIA
Chlamydia: NEGATIVE
Neisseria Gonorrhea: NEGATIVE

## 2022-03-01 LAB — OB RESULTS CONSOLE GBS: GBS: NEGATIVE

## 2022-03-03 ENCOUNTER — Inpatient Hospital Stay (HOSPITAL_COMMUNITY): Payer: 59

## 2022-03-16 ENCOUNTER — Encounter (HOSPITAL_COMMUNITY): Payer: Self-pay | Admitting: *Deleted

## 2022-03-16 ENCOUNTER — Telehealth (HOSPITAL_COMMUNITY): Payer: Self-pay | Admitting: *Deleted

## 2022-03-16 NOTE — Telephone Encounter (Signed)
Preadmission screen  

## 2022-03-21 ENCOUNTER — Other Ambulatory Visit: Payer: Self-pay | Admitting: Obstetrics and Gynecology

## 2022-03-21 DIAGNOSIS — O09523 Supervision of elderly multigravida, third trimester: Secondary | ICD-10-CM

## 2022-03-23 ENCOUNTER — Inpatient Hospital Stay (HOSPITAL_COMMUNITY): Payer: 59 | Admitting: Anesthesiology

## 2022-03-23 ENCOUNTER — Inpatient Hospital Stay (HOSPITAL_COMMUNITY)
Admission: RE | Admit: 2022-03-23 | Discharge: 2022-03-25 | DRG: 768 | Disposition: A | Discharge status: Home / Self Care | Payer: 59 | Attending: Obstetrics and Gynecology | Admitting: Obstetrics and Gynecology

## 2022-03-23 ENCOUNTER — Other Ambulatory Visit: Payer: Self-pay

## 2022-03-23 ENCOUNTER — Inpatient Hospital Stay (HOSPITAL_COMMUNITY): Payer: 59

## 2022-03-23 ENCOUNTER — Encounter (HOSPITAL_COMMUNITY): Payer: Self-pay | Admitting: Obstetrics and Gynecology

## 2022-03-23 DIAGNOSIS — Z3A39 39 weeks gestation of pregnancy: Secondary | ICD-10-CM

## 2022-03-23 DIAGNOSIS — Z8616 Personal history of COVID-19: Secondary | ICD-10-CM | POA: Diagnosis not present

## 2022-03-23 DIAGNOSIS — Z6791 Unspecified blood type, Rh negative: Secondary | ICD-10-CM

## 2022-03-23 DIAGNOSIS — O09523 Supervision of elderly multigravida, third trimester: Principal | ICD-10-CM | POA: Diagnosis present

## 2022-03-23 DIAGNOSIS — O26893 Other specified pregnancy related conditions, third trimester: Principal | ICD-10-CM | POA: Diagnosis present

## 2022-03-23 LAB — CBC
HCT: 32.4 % — ABNORMAL LOW (ref 36.0–46.0)
HCT: 28.5 % — ABNORMAL LOW (ref 36.0–46.0)
Hemoglobin: 10.6 g/dL — ABNORMAL LOW (ref 12.0–15.0)
Hemoglobin: 9.5 g/dL — ABNORMAL LOW (ref 12.0–15.0)
MCH: 29 pg (ref 26.0–34.0)
MCH: 29.7 pg (ref 26.0–34.0)
MCHC: 32.7 g/dL (ref 30.0–36.0)
MCHC: 33.3 g/dL (ref 30.0–36.0)
MCV: 88.8 fL (ref 80.0–100.0)
MCV: 89.1 fL (ref 80.0–100.0)
Platelets: 175 10*3/uL (ref 150–400)
Platelets: 158 10*3/uL (ref 150–400)
RBC: 3.65 MIL/uL — ABNORMAL LOW (ref 3.87–5.11)
RBC: 3.2 MIL/uL — ABNORMAL LOW (ref 3.87–5.11)
RDW: 13.7 % (ref 11.5–15.5)
RDW: 13.6 % (ref 11.5–15.5)
WBC: 6 10*3/uL (ref 4.0–10.5)
WBC: 10.6 10*3/uL — ABNORMAL HIGH (ref 4.0–10.5)
nRBC: 0 % (ref 0.0–0.2)
nRBC: 0 % (ref 0.0–0.2)

## 2022-03-23 LAB — TYPE AND SCREEN
ABO/RH(D): A NEG
Antibody Screen: POSITIVE

## 2022-03-23 LAB — APTT: aPTT: 29 seconds (ref 24–36)

## 2022-03-23 LAB — FIBRINOGEN: Fibrinogen: 376 mg/dL (ref 210–475)

## 2022-03-23 LAB — PROTIME-INR
INR: 1.1 (ref 0.8–1.2)
Prothrombin Time: 14.1 seconds (ref 11.4–15.2)

## 2022-03-23 LAB — RPR: RPR Ser Ql: NONREACTIVE

## 2022-03-23 MED ORDER — SIMETHICONE 80 MG PO CHEW: 80.0000 mg | CHEWABLE_TABLET | ORAL | Status: DC | PRN

## 2022-03-23 MED ORDER — LACTATED RINGERS IV SOLN
500.0000 mL | Freq: Once | INTRAVENOUS | Status: AC
  Administered 2022-03-23: 500 mL via INTRAVENOUS

## 2022-03-23 MED ORDER — DIPHENHYDRAMINE HCL 25 MG PO CAPS: 25.0000 mg | ORAL_CAPSULE | Freq: Four times a day (QID) | ORAL | Status: DC | PRN

## 2022-03-23 MED ORDER — TERBUTALINE SULFATE 1 MG/ML IJ SOLN: 0.2500 mg | Freq: Once | INTRAMUSCULAR | Status: DC | PRN

## 2022-03-23 MED ORDER — DIBUCAINE (PERIANAL) 1 % EX OINT: 1.0000 | TOPICAL_OINTMENT | CUTANEOUS | Status: DC | PRN

## 2022-03-23 MED ORDER — OXYTOCIN-SODIUM CHLORIDE 30-0.9 UT/500ML-% IV SOLN: 2.5000 [IU]/h | INTRAVENOUS | Status: DC | PRN

## 2022-03-23 MED ORDER — TRANEXAMIC ACID-NACL 1000-0.7 MG/100ML-% IV SOLN: 1000.0000 mg | Freq: Once | INTRAVENOUS | Status: DC

## 2022-03-23 MED ORDER — ONDANSETRON HCL 4 MG/2ML IJ SOLN: 4.0000 mg | INTRAMUSCULAR | Status: DC | PRN

## 2022-03-23 MED ORDER — FENTANYL-BUPIVACAINE-NACL 0.5-0.125-0.9 MG/250ML-% EP SOLN
12.0000 mL/h | EPIDURAL | Status: DC | PRN
  Administered 2022-03-23: 12 mL/h via EPIDURAL
  Filled 2022-03-23: qty 250

## 2022-03-23 MED ORDER — TETANUS-DIPHTH-ACELL PERTUSSIS 5-2.5-18.5 LF-MCG/0.5 IM SUSY: 0.5000 mL | PREFILLED_SYRINGE | Freq: Once | INTRAMUSCULAR | Status: DC

## 2022-03-23 MED ORDER — OXYCODONE-ACETAMINOPHEN 5-325 MG PO TABS: 2.0000 | ORAL_TABLET | ORAL | Status: DC | PRN

## 2022-03-23 MED ORDER — PRENATAL MULTIVITAMIN CH
1.0000 | ORAL_TABLET | Freq: Every day | ORAL | Status: DC
  Administered 2022-03-24: 1 via ORAL
  Filled 2022-03-23: qty 1

## 2022-03-23 MED ORDER — TRANEXAMIC ACID 1000 MG/10ML IV SOLN: 1000.0000 mg | Freq: Once | INTRAVENOUS | Status: DC

## 2022-03-23 MED ORDER — OXYTOCIN-SODIUM CHLORIDE 30-0.9 UT/500ML-% IV SOLN
1.0000 m[IU]/min | INTRAVENOUS | Status: DC
  Administered 2022-03-23: 2 m[IU]/min via INTRAVENOUS
  Filled 2022-03-23: qty 500

## 2022-03-23 MED ORDER — LACTATED RINGERS IV SOLN: INTRAVENOUS | Status: DC

## 2022-03-23 MED ORDER — OXYTOCIN-SODIUM CHLORIDE 30-0.9 UT/500ML-% IV SOLN
2.5000 [IU]/h | INTRAVENOUS | Status: DC
  Administered 2022-03-23: 2.5 [IU]/h via INTRAVENOUS
  Filled 2022-03-23: qty 500

## 2022-03-23 MED ORDER — OXYCODONE-ACETAMINOPHEN 5-325 MG PO TABS: 1.0000 | ORAL_TABLET | ORAL | Status: DC | PRN

## 2022-03-23 MED ORDER — LIDOCAINE HCL (PF) 1 % IJ SOLN: 30.0000 mL | INTRAMUSCULAR | Status: DC | PRN

## 2022-03-23 MED ORDER — PHENYLEPHRINE 80 MCG/ML (10ML) SYRINGE FOR IV PUSH (FOR BLOOD PRESSURE SUPPORT): 80.0000 ug | PREFILLED_SYRINGE | INTRAVENOUS | Status: DC | PRN

## 2022-03-23 MED ORDER — BENZOCAINE-MENTHOL 20-0.5 % EX AERO
1.0000 | INHALATION_SPRAY | CUTANEOUS | Status: DC | PRN
  Administered 2022-03-25: 1 via TOPICAL
  Filled 2022-03-23: qty 56

## 2022-03-23 MED ORDER — ONDANSETRON HCL 4 MG/2ML IJ SOLN
4.0000 mg | Freq: Four times a day (QID) | INTRAMUSCULAR | Status: DC | PRN
  Administered 2022-03-23: 4 mg via INTRAVENOUS
  Filled 2022-03-23: qty 2

## 2022-03-23 MED ORDER — OXYTOCIN BOLUS FROM INFUSION
333.0000 mL | Freq: Once | INTRAVENOUS | Status: AC
  Administered 2022-03-23: 333 mL via INTRAVENOUS

## 2022-03-23 MED ORDER — LACTATED RINGERS IV SOLN
500.0000 mL | INTRAVENOUS | Status: DC | PRN
  Administered 2022-03-23: 1000 mL via INTRAVENOUS

## 2022-03-23 MED ORDER — ONDANSETRON HCL 4 MG PO TABS: 4.0000 mg | ORAL_TABLET | ORAL | Status: DC | PRN

## 2022-03-23 MED ORDER — COCONUT OIL OIL: 1.0000 | TOPICAL_OIL | Status: DC | PRN

## 2022-03-23 MED ORDER — TRANEXAMIC ACID-NACL 1000-0.7 MG/100ML-% IV SOLN
INTRAVENOUS | Status: AC
  Administered 2022-03-23: 1000 mg
  Filled 2022-03-23: qty 100

## 2022-03-23 MED ORDER — EPHEDRINE 5 MG/ML INJ: 10.0000 mg | INTRAVENOUS | Status: DC | PRN

## 2022-03-23 MED ORDER — DIPHENHYDRAMINE HCL 50 MG/ML IJ SOLN: 12.5000 mg | INTRAMUSCULAR | Status: DC | PRN

## 2022-03-23 MED ORDER — IBUPROFEN 600 MG PO TABS
600.0000 mg | ORAL_TABLET | Freq: Four times a day (QID) | ORAL | Status: DC
  Administered 2022-03-24 – 2022-03-25 (×6): 600 mg via ORAL
  Filled 2022-03-23 (×6): qty 1

## 2022-03-23 MED ORDER — FENTANYL CITRATE (PF) 100 MCG/2ML IJ SOLN
50.0000 ug | INTRAMUSCULAR | Status: DC | PRN
  Administered 2022-03-23: 50 ug via INTRAVENOUS
  Administered 2022-03-23: 100 ug via INTRAVENOUS
  Filled 2022-03-23 (×2): qty 2

## 2022-03-23 MED ORDER — METHYLERGONOVINE MALEATE 0.2 MG/ML IJ SOLN: 0.2000 mg | INTRAMUSCULAR | Status: DC | PRN

## 2022-03-23 MED ORDER — METHYLERGONOVINE MALEATE 0.2 MG PO TABS: 0.2000 mg | ORAL_TABLET | ORAL | Status: DC | PRN

## 2022-03-23 MED ORDER — ACETAMINOPHEN 325 MG PO TABS
650.0000 mg | ORAL_TABLET | ORAL | Status: DC | PRN
  Administered 2022-03-24 – 2022-03-25 (×2): 650 mg via ORAL
  Filled 2022-03-23 (×2): qty 2

## 2022-03-23 MED ORDER — ZOLPIDEM TARTRATE 5 MG PO TABS: 5.0000 mg | ORAL_TABLET | Freq: Every evening | ORAL | Status: DC | PRN

## 2022-03-23 MED ORDER — SENNOSIDES-DOCUSATE SODIUM 8.6-50 MG PO TABS
2.0000 | ORAL_TABLET | Freq: Every day | ORAL | Status: DC
  Administered 2022-03-24 – 2022-03-25 (×2): 2 via ORAL
  Filled 2022-03-23 (×2): qty 2

## 2022-03-23 MED ORDER — LIDOCAINE HCL (PF) 1 % IJ SOLN
INTRAMUSCULAR | Status: DC | PRN
  Administered 2022-03-23: 3 mL via EPIDURAL
  Administered 2022-03-23: 5 mL via EPIDURAL

## 2022-03-23 MED ORDER — ACETAMINOPHEN 325 MG PO TABS
650.0000 mg | ORAL_TABLET | ORAL | Status: DC | PRN
  Administered 2022-03-23: 650 mg via ORAL
  Filled 2022-03-23: qty 2

## 2022-03-23 MED ORDER — CEFAZOLIN SODIUM-DEXTROSE 2-4 GM/100ML-% IV SOLN
2.0000 g | Freq: Three times a day (TID) | INTRAVENOUS | Status: DC
  Administered 2022-03-23: 2 g via INTRAVENOUS
  Filled 2022-03-23 (×3): qty 100

## 2022-03-23 MED ORDER — SOD CITRATE-CITRIC ACID 500-334 MG/5ML PO SOLN: 30.0000 mL | ORAL | Status: DC | PRN

## 2022-03-23 MED ORDER — METHYLERGONOVINE MALEATE 0.2 MG/ML IJ SOLN
INTRAMUSCULAR | Status: AC
  Administered 2022-03-23: 0.2 mg
  Filled 2022-03-23: qty 1

## 2022-03-23 MED ORDER — WITCH HAZEL-GLYCERIN EX PADS: 1.0000 | MEDICATED_PAD | CUTANEOUS | Status: DC | PRN

## 2022-03-23 MED ORDER — OXYCODONE HCL 5 MG PO TABS: 5.0000 mg | ORAL_TABLET | ORAL | Status: DC | PRN

## 2022-03-23 MED ORDER — OXYCODONE HCL 5 MG PO TABS: 10.0000 mg | ORAL_TABLET | ORAL | Status: DC | PRN

## 2022-03-23 NOTE — Anesthesia Procedure Notes (Signed)
Epidural Patient location during procedure: OB Start time: 03/23/2022 9:41 AM End time: 03/23/2022 9:46 AM  Staffing Anesthesiologist: Linton Rump, MD Performed: anesthesiologist   Preanesthetic Checklist Completed: patient identified, IV checked, site marked, risks and benefits discussed, surgical consent, monitors and equipment checked, pre-op evaluation and timeout performed  Epidural Patient position: sitting Prep: DuraPrep and site prepped and draped Patient monitoring: continuous pulse ox and blood pressure Approach: midline Location: L3-L4 Injection technique: LOR saline  Needle:  Needle type: Tuohy  Needle gauge: 17 G Needle length: 9 cm and 9 Needle insertion depth: 3.5 cm Catheter type: closed end flexible Catheter size: 19 Gauge Catheter at skin depth: 8 cm Test dose: negative  Assessment Events: blood not aspirated, injection not painful, no injection resistance, no paresthesia and negative IV test  Additional Notes The patient has requested an epidural for labor pain management. Risks and benefits including, but not limited to, infection, bleeding, local anesthetic toxicity, headache, hypotension, back pain, block failure, etc. were discussed with the patient. The patient expressed understanding and consented to the procedure. I confirmed that the patient has no bleeding disorders and is not taking blood thinners. I confirmed the patient's last platelet count with the nurse. A time-out was performed immediately prior to the procedure. Please see nursing documentation for vital signs. Sterile technique was used throughout the whole procedure. Once LOR achieved, the epidural catheter threaded easily without resistance. Aspiration of the catheter was negative for blood and CSF. The epidural was dosed slowly and an infusion was started.  1 attempt(s)Reason for block:procedure for pain

## 2022-03-23 NOTE — Progress Notes (Signed)
Patient ID: Holly Holder, female   DOB: [birth year], 36 y.o.   MRN: [id number]  S: comfortable after epidural Vitals:   03/23/22 1030 03/23/22 1100 03/23/22 1130 03/23/22 1131  BP: 101/63 107/66 119/74 119/74  Pulse: 87 90 98 98  Resp: 16 16 16 16   Temp:      TempSrc:      Weight:      Height:       AOx3 NAD FHR 130 reactive cat 1 tracing Cvx 4/80/-2 Toco q 2-3  AROM clear fluid Continue pit

## 2022-03-23 NOTE — H&P (Signed)
Holly Holder is a 36 y.o. female presenting for IOL for AMA  37 year old gravida 5 para 2-0-2-2 at 39+2 presents for induction of labor for advanced maternal age.  Overall, the patient's pregnancy has been uncomplicated.  Patient experienced hemorrhage in the first trimester.  Ultrasound showed subchorionic hemorrhage.  She received RhoGAM for Rh- status at this time.  She is approximately [redacted] weeks pregnant.  NIPT low risk female.  Patient traveled to [place] over the summer and had a amniocentesis performed in July.  Karyotype 44 XY.  Mild urinary tract dilatation noted on anatomy ultrasound, with subsequent resolution at 32 weeks.   OB History     Gravida  5   Para  2   Term  2   Preterm      AB  2   Living  2      SAB  2   IAB      Ectopic      Multiple  0   Live Births  2          Past Medical History:  Diagnosis Date   History of COVID-19 04/2020   per pt mild symptoms that resolved   Medical history non-contributory    Missed ab 05/16/2021   Wears contact lenses    Past Surgical History:  Procedure Laterality Date   DILATION AND EVACUATION N/A 12/20/2016   Procedure: DILATATION AND EVACUATION;  Surgeon: Waynard Reeds, MD;  Location: [facility];  Service: Gynecology;  Laterality: N/A;   DILATION AND EVACUATION N/A 05/19/2021   Procedure: SUCTION DILATION & CURETTAGE;  Surgeon: Waynard Reeds, MD;  Location: [facility] [place];  Service: Gynecology;  Laterality: N/A;   Family History: family history includes Cancer in her maternal grandmother; Healthy in her father and mother. Social History:  reports that she has never smoked. She has never used smokeless tobacco. She reports that she does not drink alcohol and does not use drugs.     Maternal Diabetes: No Genetic Screening: Normal Maternal Ultrasounds/Referrals: Normal and Fetal renal pyelectasis, RRP 8 mm/LRP 5 mm @ 32 wks, WNL Fetal Ultrasounds or other Referrals:  None Maternal Substance Abuse:   No Significant Maternal Medications:  None Significant Maternal Lab Results:  Rh negative Number of Prenatal Visits:greater than 3 verified prenatal visits Other Comments:  None  Review of Systems History Dilation: 3.5 Effacement (%): 80 Station: -2 Exam by:: Dr Tenny Craw Blood pressure 125/73, pulse (!) 105, temperature 98 F (36.7 C), temperature source Oral, resp. rate 16, height 5\' 4"  (1.626 m), weight 68.6 kg, last menstrual period 06/21/2021, unknown if currently breastfeeding. Exam Physical Exam  AOx3, NAD Abd gravid, soft, NT Cvx 3-4/80/-2 FHR 140 reactive, cat 1 tracing  Prenatal labs: ABO, Rh: --/--/A NEG (12/08 0835) Antibody: POS (12/08 11-10-1983) Rubella: Immune (05/11 0000) RPR: Nonreactive (05/11 0000)  HBsAg: Negative (05/11 0000)  HIV: Non-reactive (05/11 0000)  GBS: Negative/-- (11/16 0000)  Results for orders placed or performed during the hospital encounter of 03/23/22 (from the past 24 hour(s))  CBC     Status: Abnormal   Collection Time: 03/23/22  8:35 AM  Result Value Ref Range   WBC 6.0 4.0 - 10.5 K/uL   RBC 3.65 (L) 3.87 - 5.11 MIL/uL   Hemoglobin 10.6 (L) 12.0 - 15.0 g/dL   HCT 14/08/23 (L) 66.0 - 63.0 %   MCV 88.8 80.0 - 100.0 fL   MCH 29.0 26.0 - 34.0 pg   MCHC 32.7 30.0 - 36.0 g/dL  RDW 13.7 11.5 - 15.5 %   Platelets 175 150 - 400 K/uL   nRBC 0.0 0.0 - 0.2 %  Type and screen     Status: None   Collection Time: 03/23/22  8:35 AM  Result Value Ref Range   ABO/RH(D) A NEG    Antibody Screen POS    Sample Expiration      03/26/2022,2359 Performed at [facility], [address] [place], [place] [address]      Assessment/Plan: 1) Admit 2) Epidural 3) Pit/AROM   Waynard Reeds 03/23/2022, 9:47 AM

## 2022-03-23 NOTE — Anesthesia Preprocedure Evaluation (Signed)
Anesthesia Evaluation  Patient identified by MRN, date of birth, ID band Patient awake    Reviewed: Allergy & Precautions, NPO status , Patient's Chart, lab work & pertinent test results  History of Anesthesia Complications Negative for: history of anesthetic complications  Airway Mallampati: III  TM Distance: >3 FB Neck ROM: Full    Dental no notable dental hx.    Pulmonary neg pulmonary ROS   Pulmonary exam normal breath sounds clear to auscultation       Cardiovascular negative cardio ROS  Rhythm:Regular Rate:Normal     Neuro/Psych negative neurological ROS     GI/Hepatic negative GI ROS, Neg liver ROS,,,  Endo/Other  negative endocrine ROS    Renal/GU negative Renal ROS     Musculoskeletal   Abdominal   Peds  Hematology negative hematology ROS (+)   Anesthesia Other Findings   Reproductive/Obstetrics (+) Pregnancy                             Anesthesia Physical Anesthesia Plan  ASA: 2  Anesthesia Plan: Epidural   Post-op Pain Management:    Induction:   PONV Risk Score and Plan: 2  Airway Management Planned:   Additional Equipment:   Intra-op Plan:   Post-operative Plan:   Informed Consent: I have reviewed the patients History and Physical, chart, labs and discussed the procedure including the risks, benefits and alternatives for the proposed anesthesia with the patient or authorized representative who has indicated his/her understanding and acceptance.       Plan Discussed with:   Anesthesia Plan Comments: (I have discussed risks of neuraxial anesthesia including but not limited to infection, bleeding, nerve injury, back pain, headache, seizures, and failure of block. Patient denies bleeding disorders and is not currently anticoagulated. Labs have been reviewed. Risks and benefits discussed. All patient's questions answered.  )       Anesthesia Quick  Evaluation

## 2022-03-24 LAB — CBC
HCT: 25.6 % — ABNORMAL LOW (ref 36.0–46.0)
Hemoglobin: 9 g/dL — ABNORMAL LOW (ref 12.0–15.0)
MCH: 30.5 pg (ref 26.0–34.0)
MCHC: 35.2 g/dL (ref 30.0–36.0)
MCV: 86.8 fL (ref 80.0–100.0)
Platelets: 140 10*3/uL — ABNORMAL LOW (ref 150–400)
RBC: 2.95 MIL/uL — ABNORMAL LOW (ref 3.87–5.11)
RDW: 13.7 % (ref 11.5–15.5)
WBC: 9.8 10*3/uL (ref 4.0–10.5)
nRBC: 0 % (ref 0.0–0.2)

## 2022-03-24 MED ORDER — RHO D IMMUNE GLOBULIN 1500 UNIT/2ML IJ SOSY
300.0000 ug | PREFILLED_SYRINGE | Freq: Once | INTRAMUSCULAR | Status: AC
  Administered 2022-03-24: 300 ug via INTRAVENOUS
  Filled 2022-03-24: qty 2

## 2022-03-24 MED ORDER — FERROUS SULFATE 325 (65 FE) MG PO TABS: 325.0000 mg | ORAL_TABLET | Freq: Every day | ORAL | 3 refills | Status: AC

## 2022-03-24 MED ORDER — METHYLERGONOVINE MALEATE 0.2 MG/ML IJ SOLN: 0.2000 mg | INTRAMUSCULAR | Status: DC | PRN

## 2022-03-24 MED ORDER — FERROUS SULFATE 325 (65 FE) MG PO TABS
325.0000 mg | ORAL_TABLET | Freq: Every day | ORAL | Status: DC
  Administered 2022-03-24 – 2022-03-25 (×2): 325 mg via ORAL
  Filled 2022-03-24 (×2): qty 1

## 2022-03-24 NOTE — Anesthesia Postprocedure Evaluation (Signed)
Anesthesia Post Note  Patient: Holly Holder  Procedure(s) Performed: AN AD HOC LABOR EPIDURAL     Patient location during evaluation: Mother Baby Anesthesia Type: Epidural Level of consciousness: awake and alert Pain management: pain level controlled Vital Signs Assessment: post-procedure vital signs reviewed and stable Respiratory status: spontaneous breathing, nonlabored ventilation and respiratory function stable Cardiovascular status: stable Postop Assessment: no headache, no backache and epidural receding Anesthetic complications: no   No notable events documented.  Last Vitals:  Vitals:   03/24/22 0050 03/24/22 0351  BP: 113/64 (!) 91/58  Pulse: 64 82  Resp: 16 16  Temp: 37.1 C 37.1 C  SpO2:      Last Pain:  Vitals:   03/24/22 0351  TempSrc: Oral  PainSc: 2    Pain Goal:                   [name]

## 2022-03-24 NOTE — Discharge Summary (Signed)
Postpartum Discharge Summary  Date of Service updated      Patient Name: Holly Holder DOB: Nov 28, 1985 MRN: [id number]  Date of admission: 03/23/2022 Delivery date:03/23/2022  Delivering provider: Vanessa Kick  Date of discharge: 03/24/2022  Admitting diagnosis: Advanced maternal age in multigravida, third trimester [O09.523] Spontaneous vaginal delivery [O80] Intrauterine pregnancy: [redacted]w[redacted]d    Secondary diagnosis:  Principal Problem:   Advanced maternal age in multigravida, third trimester Active Problems:   Spontaneous vaginal delivery  Additional problems: none    Discharge diagnosis: Term Pregnancy Delivered                                              Post partum procedures:rhogam Augmentation: AROM and Pitocin Complications: None  Hospital course: Induction of Labor With Vaginal Delivery   36y.o. yo [id number] admitted to the hospital 03/23/2022 for induction of labor.  Indication for induction: AMA.  Patient had an labor course complicated bynone Membrane Rupture Time/Date: 11:31 AM ,03/23/2022   Delivery Method:Vaginal, Spontaneous  Episiotomy: None  Lacerations:  3rd degree  Details of delivery can be found in separate delivery note.  Patient had a postpartum course complicated bynone. Patient is discharged home 03/24/22.  Newborn Data: Birth date:03/23/2022  Birth time:4:58 PM  Gender:Female  Living status:Living  Apgars:8 ,9  Weight:3880 g   Magnesium Sulfate received: No BMZ received: No Rhophylac:Yes MMR:No T-DaP:Given prenatally Flu: No Transfusion:Yes  Physical exam  Vitals:   03/23/22 2256 03/23/22 2308 03/24/22 0050 03/24/22 0351  BP: 98/65 90/71 113/64 (!) 91/58  Pulse: 89 78 64 82  Resp: _0 Temp: 99.1 F (37.3 C)  98.8 F (37.1 C) 98.8 F (37.1 C)  TempSrc: Oral  Oral Oral  SpO2:      Weight:      Height:        Labs: Lab Results  Component Value Date   WBC 9.8 03/24/2022   HGB 9.0 (L) 03/24/2022   HCT  25.6 (L) 03/24/2022   MCV 86.8 03/24/2022   PLT 140 (L) 03/24/2022       No data to display         Edinburgh Score:    12/16/2017   11:15 AM  Edinburgh Postnatal Depression Scale Screening Tool  I have been able to laugh and see the funny side of things. 0  I have looked forward with enjoyment to things. 0  I have blamed myself unnecessarily when things went wrong. 1  I have been anxious or worried for no good reason. 1  I have felt scared or panicky for no good reason. 0  Things have been getting on top of me. 1  I have been so unhappy that I have had difficulty sleeping. 0  I have felt sad or miserable. 0  I have been so unhappy that I have been crying. 0  The thought of harming myself has occurred to me. 0  Edinburgh Postnatal Depression Scale Total 3      After visit meds:  Allergies as of 03/24/2022   No Known Allergies      Medication List    You have not been prescribed any medications.      Discharge home in stable condition Infant Feeding:  ? Infant Disposition:home with mother Discharge instruction: per After Visit Summary and Postpartum booklet. Activity:  Advance as tolerated. Pelvic rest for 6 weeks.  Diet: routine diet Anticipated Birth Control: Unsure Postpartum Appointment:4 weeks Additional Postpartum F/U:  none Future Appointments:No future appointments. Follow up Visit:  Follow-up Information     Vanessa Kick, MD Follow up in 4 week(s).   Specialty: Obstetrics and Gynecology Contact information: [address] [place] [address] [phone]                     03/24/2022 Daria Pastures, MD

## 2022-03-24 NOTE — Progress Notes (Addendum)
Patient is eating, ambulating, voiding.  Pain control is good.  Vitals:   03/23/22 2256 03/23/22 2308 03/24/22 0050 03/24/22 0351  BP: 98/65 90/71 113/64 (!) 91/58  Pulse: 89 78 64 82  Resp: 16 16 16 16   Temp: 99.1 F (37.3 C)  98.8 F (37.1 C) 98.8 F (37.1 C)  TempSrc: Oral  Oral Oral  SpO2:      Weight:      Height:        Fundus firm Perineum without swelling.  Lab Results  Component Value Date   WBC 9.8 03/24/2022   HGB 9.0 (L) 03/24/2022   HCT 25.6 (L) 03/24/2022   MCV 86.8 03/24/2022   PLT 140 (L) 03/24/2022    --/--/A NEG (12/08 0835)/RI  A/P Post partum day 1.  Rhogam - baby RH POS.  Routine care.  Expect d/c routine.  Iron.  11-10-1983

## 2022-03-24 NOTE — Discharge Summary (Incomplete Revision)
Postpartum Discharge Summary  Date of Service updated      Patient Name: Holly Holder DOB: Mar 29, 1986 MRN: [id number]  Date of admission: 03/23/2022 Delivery date:03/23/2022  Delivering provider: Vanessa Kick  Date of discharge: 03/24/2022  Admitting diagnosis: Advanced maternal age in multigravida, third trimester [O09.523] Spontaneous vaginal delivery [O80] Intrauterine pregnancy: [redacted]w[redacted]d    Secondary diagnosis:  Principal Problem:   Advanced maternal age in multigravida, third trimester Active Problems:   Spontaneous vaginal delivery  Additional problems: none    Discharge diagnosis: Term Pregnancy Delivered                                              Post partum procedures:rhogam Augmentation: AROM and Pitocin Complications: None  Hospital course: Induction of Labor With Vaginal Delivery   36y.o. yo [id number] admitted to the hospital 03/23/2022 for induction of labor.  Indication for induction: AMA.  Patient had an labor course complicated bynone Membrane Rupture Time/Date: 11:31 AM ,03/23/2022   Delivery Method:Vaginal, Spontaneous  Episiotomy: None  Lacerations:  3rd degree  Details of delivery can be found in separate delivery note.  Patient had a postpartum course complicated by PP hemorrahge and manual extraction of placenta. Patient is discharged home .  Newborn Data: Birth date:03/23/2022  Birth time:4:58 PM  Gender:Female  Living status:Living  Apgars:8 ,9  Weight:3880 g   Magnesium Sulfate received: No BMZ received: No Rhophylac:Yes MMR:No T-DaP:Given prenatally Flu: No Transfusion:no  Physical exam  Vitals:   03/23/22 2256 03/23/22 2308 03/24/22 0050 03/24/22 0351  BP: 98/65 90/71 113/64 (!) 91/58  Pulse: 89 78 64 82  Resp: _0 Temp: 99.1 F (37.3 C)  98.8 F (37.1 C) 98.8 F (37.1 C)  TempSrc: Oral  Oral Oral  SpO2:      Weight:      Height:        Labs: Lab Results  Component Value Date   WBC 9.8 03/24/2022    HGB 9.0 (L) 03/24/2022   HCT 25.6 (L) 03/24/2022   MCV 86.8 03/24/2022   PLT 140 (L) 03/24/2022       No data to display         Edinburgh Score:    12/16/2017   11:15 AM  Edinburgh Postnatal Depression Scale Screening Tool  I have been able to laugh and see the funny side of things. 0  I have looked forward with enjoyment to things. 0  I have blamed myself unnecessarily when things went wrong. 1  I have been anxious or worried for no good reason. 1  I have felt scared or panicky for no good reason. 0  Things have been getting on top of me. 1  I have been so unhappy that I have had difficulty sleeping. 0  I have felt sad or miserable. 0  I have been so unhappy that I have been crying. 0  The thought of harming myself has occurred to me. 0  Edinburgh Postnatal Depression Scale Total 3      After visit meds:  Allergies as of 03/24/2022   No Known Allergies      Medication List     TAKE these medications    ferrous sulfate 325 (65 FE) MG tablet Take 1 tablet (325 mg total) by mouth daily with breakfast.  Discharge home in stable condition Infant Feeding:  ? Infant Disposition:home with mother Discharge instruction: per After Visit Summary and Postpartum booklet. Activity: Advance as tolerated. Pelvic rest for 6 weeks.  Diet: routine diet Anticipated Birth Control: Unsure Postpartum Appointment:4 weeks Additional Postpartum F/U:  none Future Appointments:No future appointments. Follow up Visit:  Follow-up Information     Vanessa Kick, MD Follow up in 4 week(s).   Specialty: Obstetrics and Gynecology Contact information: [address] [place] [address] [phone]                     03/24/2022 Daria Pastures, MD

## 2022-03-24 NOTE — Progress Notes (Signed)
Entered patients room for assessment of neonate and for medication administration. Patient wishes to rest and will call RN when she is awake for assessment and medication.

## 2022-03-24 NOTE — Lactation Note (Signed)
This note was copied from a baby's chart. Lactation Consultation Note  Patient Name: Holly Holder [id number] Date: 03/24/2022 Reason for consult: Initial assessment;Term (DAT+) Age:37 hours, P3 Birth Parent is experienced with breastfeeding see maternal data below. Birth Parent latched infant on her right breast using the cross cradle hold, infant latched with depth and was still breastfeeding when [facility] left the room. Birth Parent will continue to breastfeed infant according to hunger cues, on demand, 8 to 12+ times within 24 hours, STS. Birth Parent knows if infant is still cuing after latching infant on the 1st breast to offer the 2nd breast within the same feeding. Birth Parent knows to call RN/LC for further latch assistance if needed. LC discussed maternal rest, diet and hydration with Birth Parent. Mom made aware of O/P services, breastfeeding support groups, community resources, and our phone # for post-discharge questions.    Maternal Data Has patient been taught Hand Expression?: Yes Does the patient have breastfeeding experience prior to this delivery?: Yes How long did the patient breastfeed?: Per Birth Parent she BF 1st child for 2 years and 2nd child for 18 months who is currently 62 years old.  Feeding Mother's Current Feeding Choice: Breast Milk  LATCH Score Latch: Grasps breast easily, tongue down, lips flanged, rhythmical sucking.  Audible Swallowing: A few with stimulation  Type of Nipple: Everted at rest and after stimulation  Comfort (Breast/Nipple): Soft / non-tender  Hold (Positioning): Assistance needed to correctly position infant at breast and maintain latch.  LATCH Score: 8   Lactation Tools Discussed/Used    Interventions Interventions: Breast feeding basics reviewed;Adjust position;Assisted with latch;Support pillows;Skin to skin;Position options;Breast compression;Education;LC Services brochure  Discharge Pump: DEBP;Personal (Spectra DEBP at  home.)  Consult Status Consult Status: Follow-up Date: 03/24/22 Follow-up type: In-patient    Frederico Hamman 03/24/2022, 12:07 AM

## 2022-03-25 NOTE — Lactation Note (Signed)
This note was copied from a baby's chart. Lactation Consultation Note  Patient Name: Boy Boni Maclellan [id number] Date: 03/25/2022 Reason for consult: Follow-up assessment;Infant weight loss;Term (per mom baby recently fed and LC updated the doc flow sheets per mom. Per mom breast feeding is going well both breast . LC reviewed BF D/C teaching and LC resources. mom declined a hand pump for D/C .) Age:36 hours  Maternal Data Has patient been taught Hand Expression?: Yes  Feeding Mother's Current Feeding Choice: Breast Milk  LATCH Score - LC unable to assess baby recently fed     Lactation Tools Discussed/Used Tools:  (declined a hand pump)  Interventions Interventions: Breast feeding basics reviewed;Education;LC Services brochure  Discharge Discharge Education: Engorgement and breast care;Warning signs for feeding baby Pump: DEBP;Personal  Consult Status Consult Status: Complete Date: 03/25/22    Kathrin Greathouse 03/25/2022, 2:14 PM

## 2022-03-25 NOTE — Lactation Note (Signed)
This note was copied from a baby's chart. Lactation Consultation Note  Patient Name: Boy Dorreen Valiente [id number] Date: 03/25/2022   Age:36 hours Per RN, Birth Parent will call if she wants to be seen by [facility] services tonight.  Maternal Data    Feeding    LATCH Score                    Lactation Tools Discussed/Used    Interventions    Discharge    Consult Status      Frederico Hamman 03/25/2022, 1:04 AM

## 2022-03-25 NOTE — Progress Notes (Signed)
Patient is eating, ambulating, voiding.  Pain control is good.  Vitals:   03/24/22 1216 03/24/22 1552 03/24/22 2115 03/25/22 0653  BP: 106/67 112/76 101/69 110/78  Pulse: 83 90 80 97  Resp: 17 18    Temp: 97.6 F (36.4 C) 98.4 F (36.9 C) 97.7 F (36.5 C) 98.2 F (36.8 C)  TempSrc: Oral Oral Oral Oral  SpO2: 99% 98% 99% 100%  Weight:      Height:        Fundus firm Perineum without swelling.  Lab Results  Component Value Date   WBC 9.8 03/24/2022   HGB 9.0 (L) 03/24/2022   HCT 25.6 (L) 03/24/2022   MCV 86.8 03/24/2022   PLT 140 (L) 03/24/2022    --/--/A NEG (12/08 0835)/RI  A/P Post partum day 2.  Received Rhogam.  Routine care.  Expect d/c today.  Iron.  Loney Laurence

## 2022-03-25 NOTE — Plan of Care (Signed)

## 2022-03-26 LAB — RH IG WORKUP (INCLUDES ABO/RH)
Fetal Screen: NEGATIVE
Gestational Age(Wks): 39
Unit division: 0

## 2022-03-27 LAB — SURGICAL PATHOLOGY

## 2022-03-31 ENCOUNTER — Telehealth (HOSPITAL_COMMUNITY): Payer: Self-pay

## 2022-03-31 NOTE — Telephone Encounter (Signed)
Patient did not answer phone call. Voicemail left for patient.   Marcelino Duster [facility] 03/31/22,0902

## 2023-02-07 IMAGING — US US OB < 14 WEEKS - US OB TV
1 series · 15 of 28 positions shown · non-contrast
Comparison: None.

CLINICAL DATA: Vaginal bleeding

EXAM:
OBSTETRIC <14 WK US AND TRANSVAGINAL OB US
TECHNIQUE: Both transabdominal and transvaginal ultrasound examinations were
performed for complete evaluation of the gestation as well as the
maternal uterus, adnexal regions, and pelvic cul-de-sac.
Transvaginal technique was performed to assess early pregnancy.

[Series 1: us ob < 14 weeks - us ob tv · 15 of 38 slices shown]
[im 1/38]
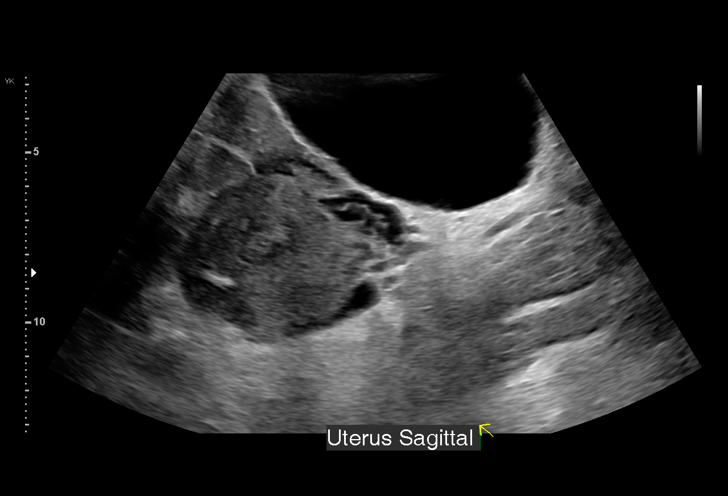
[im 3/38]
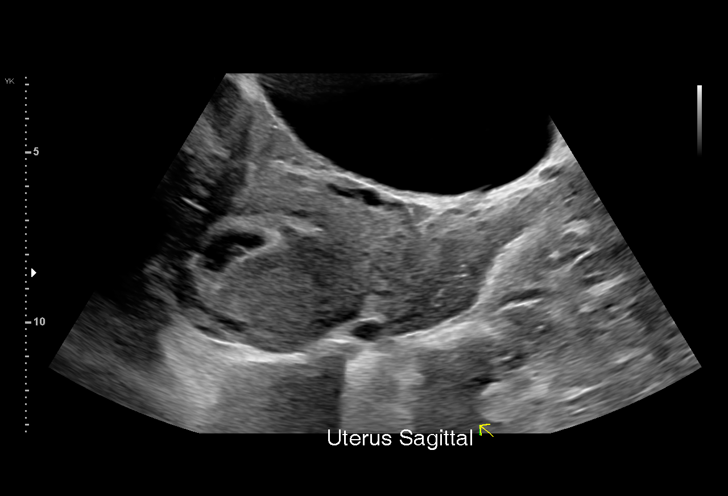
[im 6/38]
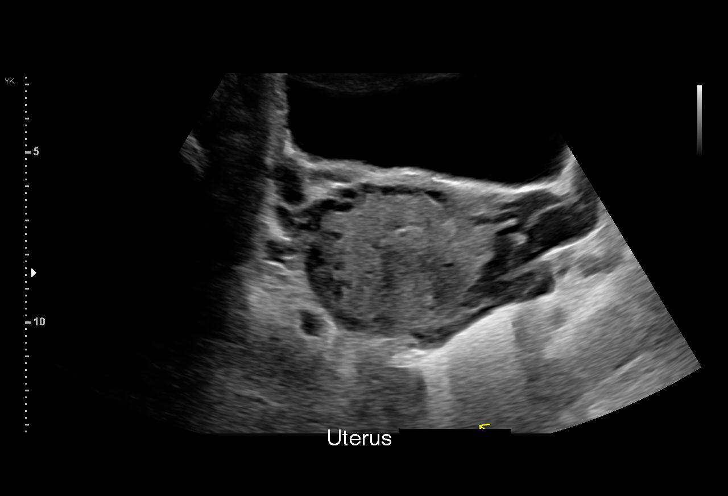
[im 9/38]
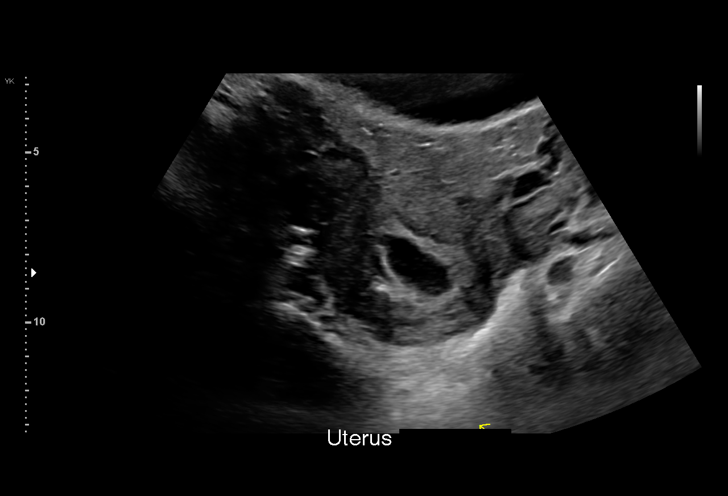
[im 11/38]
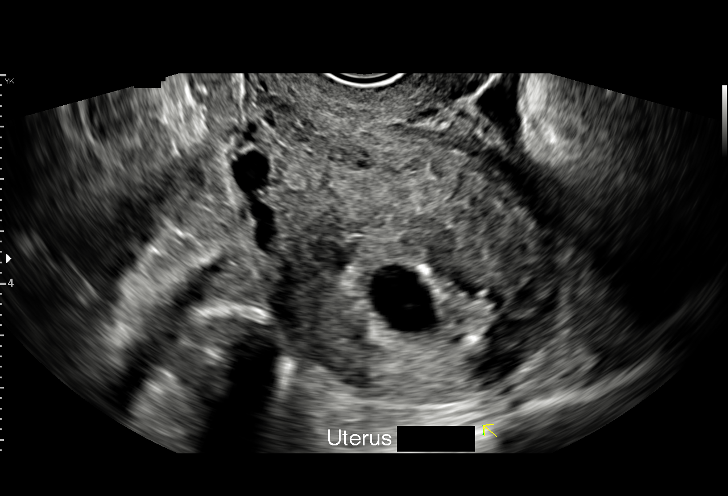
[im 14/38]
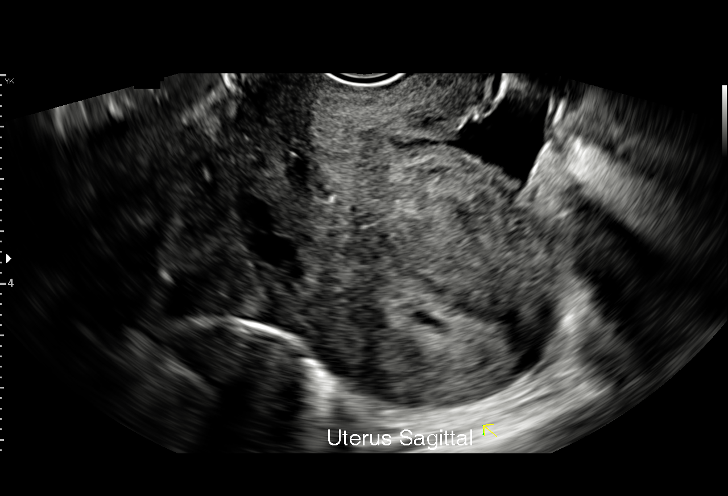
[im 17/38]
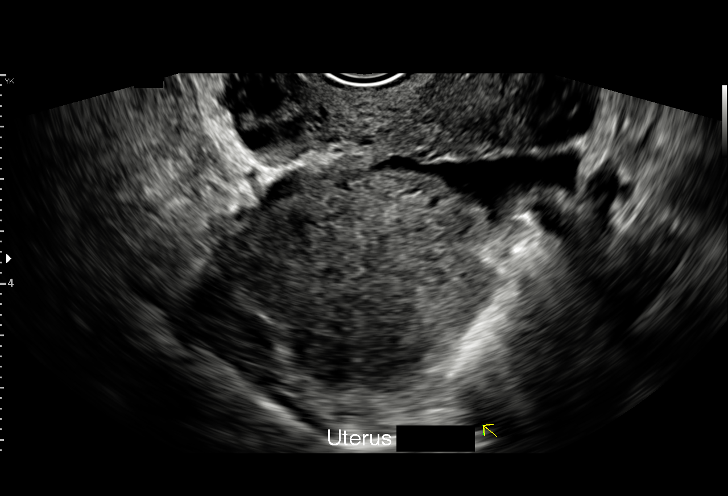
[im 20/38]
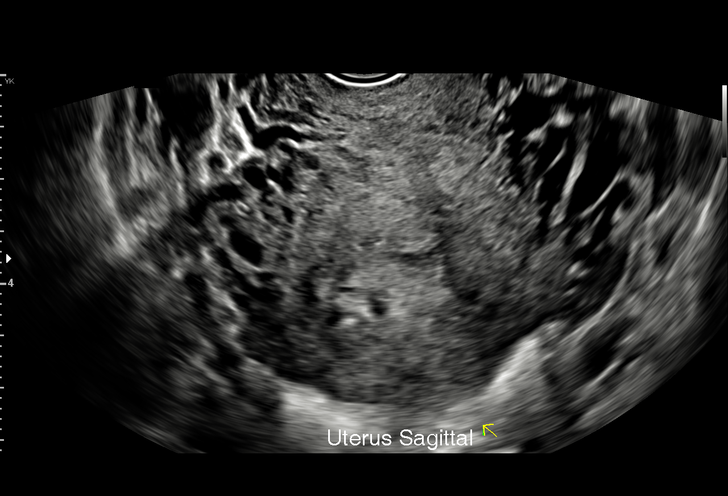
[im 21/38]
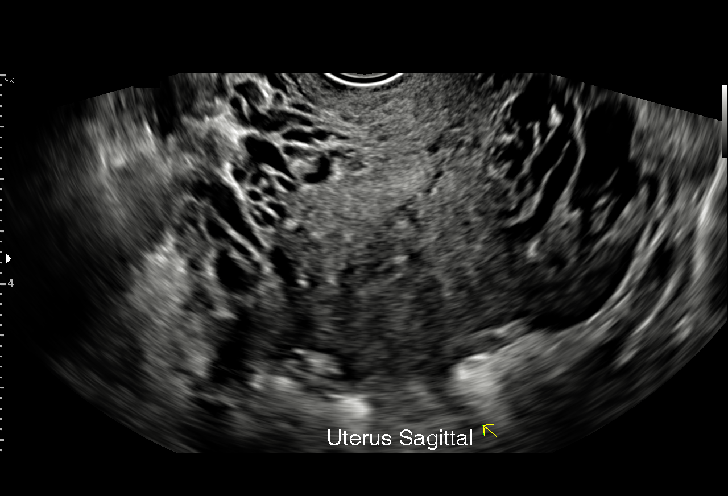
[im 24/38]
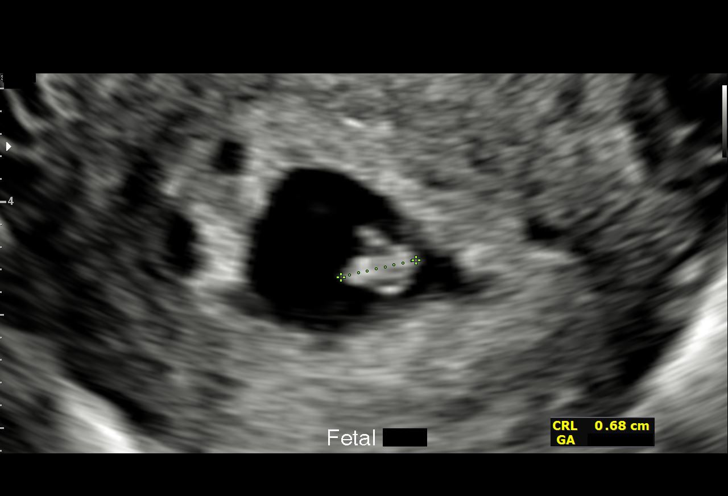
[im 27/38]
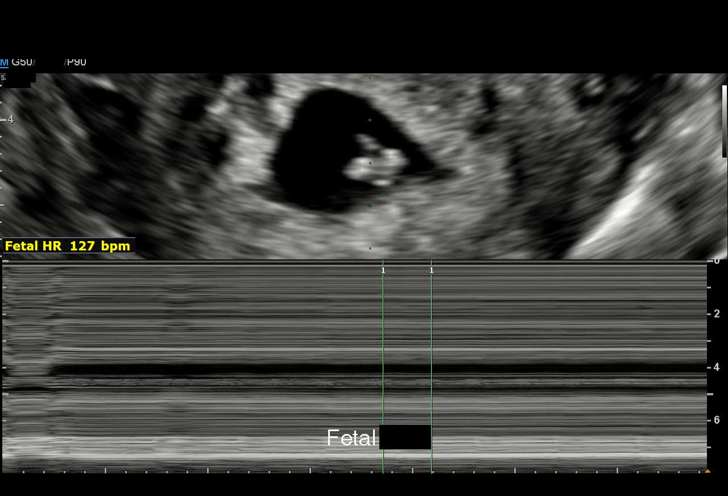
[im 29/38]
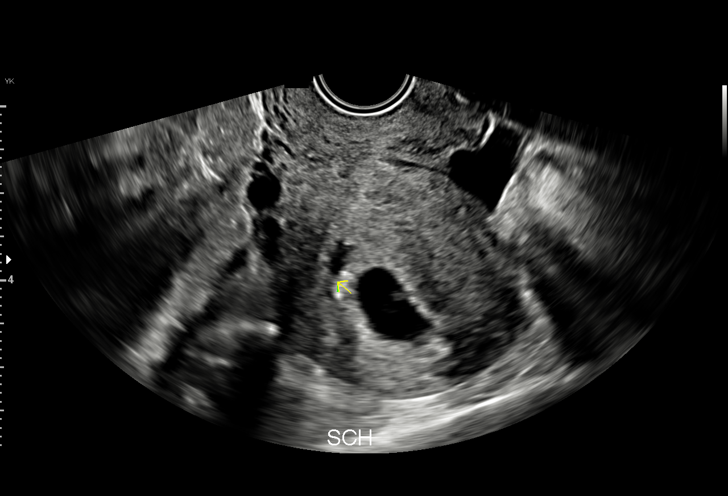
[im 32/38]
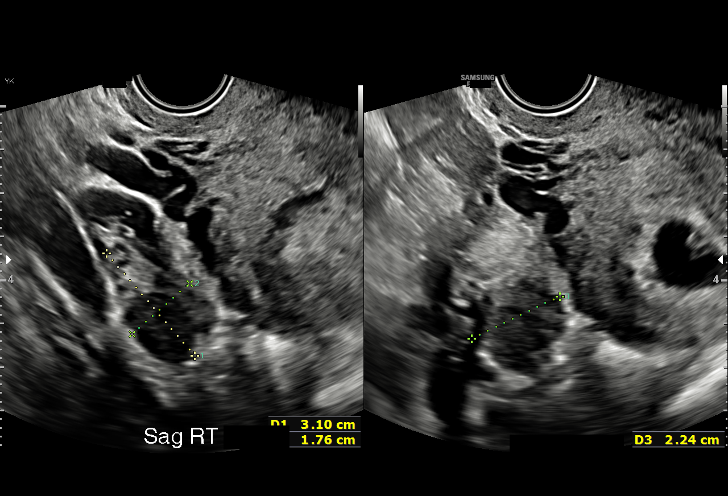
[im 35/38]
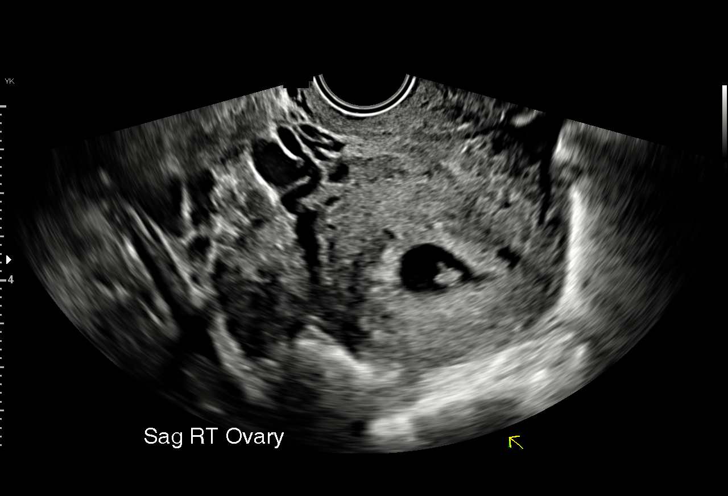
[im 38/38]
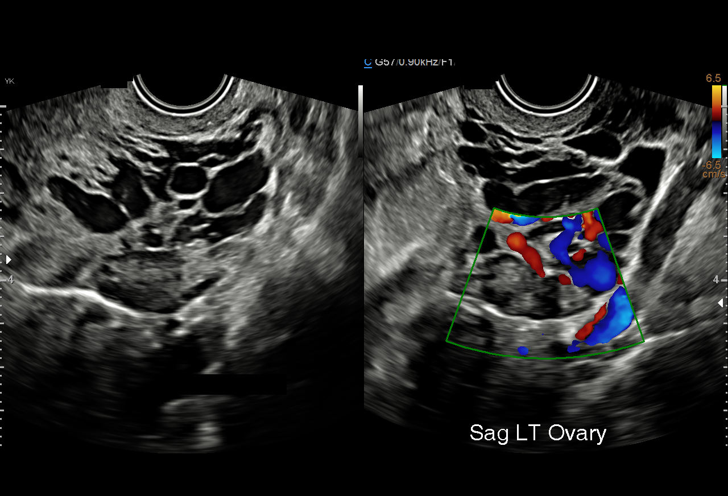

[15 of 28 positions shown; findings below may reference images not displayed]

FINDINGS: Intrauterine gestational sac: Single

Yolk sac:  Visualized.

Embryo:  Visualized.

Cardiac Activity: Visualized.

Heart Rate: 124 bpm

CRL:  6.9 mm   6 w   3 d                  US EDC: 03/29/2022

Subchorionic hemorrhage:  None visualized.

Maternal uterus/adnexae: Right ovarian cyst measuring up to 1.7 cm.
IMPRESSION: Single live intrauterine gestation with approximate gestational age
of 6 weeks and 3 days, EDC based on today's sonogram is 03/29/2022.
